# Patient Record
Sex: Female | Born: 1935 | Race: White | Hispanic: No | Smoking: Never smoker
Health system: Southern US, Community
[De-identification: ages and names within clinical notes are randomized; demographics above are authoritative.]

## PROBLEM LIST (undated history)

## (undated) DIAGNOSIS — Z9981 Dependence on supplemental oxygen: Secondary | ICD-10-CM

## (undated) DIAGNOSIS — J189 Pneumonia, unspecified organism: Secondary | ICD-10-CM

## (undated) DIAGNOSIS — M1990 Unspecified osteoarthritis, unspecified site: Secondary | ICD-10-CM

## (undated) DIAGNOSIS — M19019 Primary osteoarthritis, unspecified shoulder: Secondary | ICD-10-CM

## (undated) DIAGNOSIS — J42 Unspecified chronic bronchitis: Secondary | ICD-10-CM

## (undated) DIAGNOSIS — E669 Obesity, unspecified: Secondary | ICD-10-CM

## (undated) DIAGNOSIS — J4489 Other specified chronic obstructive pulmonary disease: Secondary | ICD-10-CM

## (undated) DIAGNOSIS — E785 Hyperlipidemia, unspecified: Secondary | ICD-10-CM

## (undated) DIAGNOSIS — R000 Tachycardia, unspecified: Secondary | ICD-10-CM

## (undated) DIAGNOSIS — Z9109 Other allergy status, other than to drugs and biological substances: Secondary | ICD-10-CM

## (undated) DIAGNOSIS — R413 Other amnesia: Secondary | ICD-10-CM

## (undated) DIAGNOSIS — I2699 Other pulmonary embolism without acute cor pulmonale: Secondary | ICD-10-CM

## (undated) DIAGNOSIS — J449 Chronic obstructive pulmonary disease, unspecified: Secondary | ICD-10-CM

## (undated) DIAGNOSIS — I10 Essential (primary) hypertension: Secondary | ICD-10-CM

## (undated) HISTORY — PX: DILATION AND CURETTAGE OF UTERUS: SHX78

## (undated) HISTORY — DX: Other amnesia: R41.3

## (undated) HISTORY — PX: CATARACT EXTRACTION, BILATERAL: SHX1313

## (undated) HISTORY — PX: TONSILLECTOMY: SUR1361

## (undated) HISTORY — PX: CARDIAC CATHETERIZATION: SHX172

## (undated) HISTORY — PX: ABDOMINAL HYSTERECTOMY: SHX81

---

## 1998-07-17 ENCOUNTER — Emergency Department (HOSPITAL_COMMUNITY): Admission: EM | Admit: 1998-07-17 | Discharge: 1998-07-17 | Payer: Self-pay | Admitting: Emergency Medicine

## 1998-07-17 ENCOUNTER — Encounter: Payer: Self-pay | Admitting: Emergency Medicine

## 1999-03-25 ENCOUNTER — Other Ambulatory Visit: Admission: RE | Admit: 1999-03-25 | Discharge: 1999-03-25 | Payer: Self-pay | Admitting: Obstetrics and Gynecology

## 1999-10-23 ENCOUNTER — Emergency Department (HOSPITAL_COMMUNITY): Admission: EM | Admit: 1999-10-23 | Discharge: 1999-10-23 | Payer: Self-pay | Admitting: Emergency Medicine

## 1999-11-06 ENCOUNTER — Emergency Department (HOSPITAL_COMMUNITY): Admission: EM | Admit: 1999-11-06 | Discharge: 1999-11-06 | Payer: Self-pay | Admitting: Internal Medicine

## 2000-01-12 ENCOUNTER — Encounter (INDEPENDENT_AMBULATORY_CARE_PROVIDER_SITE_OTHER): Payer: Self-pay | Admitting: Specialist

## 2000-01-12 ENCOUNTER — Other Ambulatory Visit: Admission: RE | Admit: 2000-01-12 | Discharge: 2000-01-12 | Payer: Self-pay | Admitting: Otolaryngology

## 2001-08-28 ENCOUNTER — Other Ambulatory Visit: Admission: RE | Admit: 2001-08-28 | Discharge: 2001-08-28 | Payer: Self-pay | Admitting: Internal Medicine

## 2004-01-27 ENCOUNTER — Inpatient Hospital Stay (HOSPITAL_COMMUNITY): Admission: EM | Admit: 2004-01-27 | Discharge: 2004-02-03 | Payer: Self-pay | Admitting: Emergency Medicine

## 2004-07-14 ENCOUNTER — Emergency Department (HOSPITAL_COMMUNITY): Admission: EM | Admit: 2004-07-14 | Discharge: 2004-07-15 | Payer: Self-pay | Admitting: Emergency Medicine

## 2006-08-23 ENCOUNTER — Encounter: Admission: RE | Admit: 2006-08-23 | Discharge: 2006-08-23 | Payer: Self-pay | Admitting: Endocrinology

## 2006-10-06 ENCOUNTER — Ambulatory Visit: Payer: Self-pay | Admitting: Internal Medicine

## 2006-10-06 ENCOUNTER — Inpatient Hospital Stay (HOSPITAL_COMMUNITY): Admission: EM | Admit: 2006-10-06 | Discharge: 2006-10-11 | Payer: Self-pay | Admitting: Emergency Medicine

## 2006-10-09 ENCOUNTER — Encounter (INDEPENDENT_AMBULATORY_CARE_PROVIDER_SITE_OTHER): Payer: Self-pay | Admitting: Cardiology

## 2008-04-04 ENCOUNTER — Ambulatory Visit: Payer: Self-pay | Admitting: Internal Medicine

## 2008-09-12 ENCOUNTER — Encounter: Admission: RE | Admit: 2008-09-12 | Discharge: 2008-09-12 | Payer: Self-pay | Admitting: Internal Medicine

## 2008-09-22 ENCOUNTER — Inpatient Hospital Stay (HOSPITAL_COMMUNITY): Admission: EM | Admit: 2008-09-22 | Discharge: 2008-09-27 | Payer: Self-pay | Admitting: Emergency Medicine

## 2008-09-25 ENCOUNTER — Encounter (INDEPENDENT_AMBULATORY_CARE_PROVIDER_SITE_OTHER): Payer: Self-pay | Admitting: Internal Medicine

## 2009-03-05 IMAGING — CT CT HEAD W/O CM
3 of 5 series · 16 of 47 positions shown, 19 images · IV contrast (omnipaque)
Comparison: Prior CT brain of 07/14/04.
COMPARISON: CT angio chest of 01/27/04.  Some scarring is noted in the lower lobes.

CLINICAL DATA: Syncope.  Short of breath with exertion.  
 HEAD CT WITHOUT CONTRAST:
TECHNIQUE: Contiguous axial images were obtained from the base of the skull through the vertex according to standard protocol without contrast.
TECHNIQUE: Multidetector CT imaging of the chest was performed during bolus injection of intravenous contrast.  Multiplanar   CT angiographic image reconstructions were generated to evaluate the vascular anatomy.
 Contrast:  125 cc Omnipaque 300

[Series 7: pe study · axial · 0.55mm/px · z∈[-368,-95]mm · 10 of 123 slices shown, 13 images]
[im 7/123  brain]
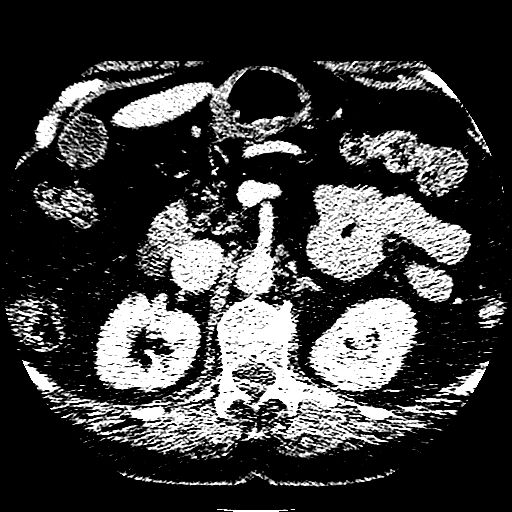
[im 7/123  bone]
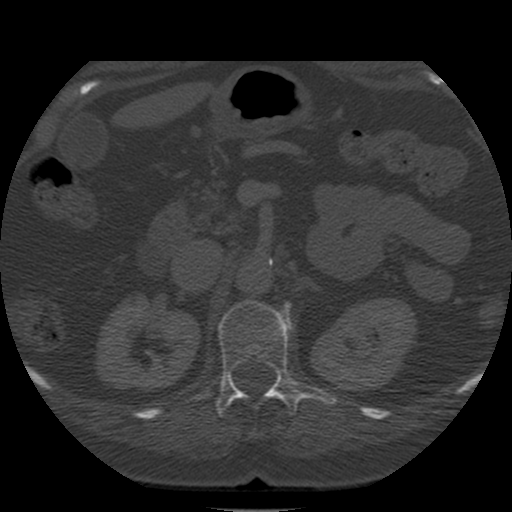
[im 20/123  brain]
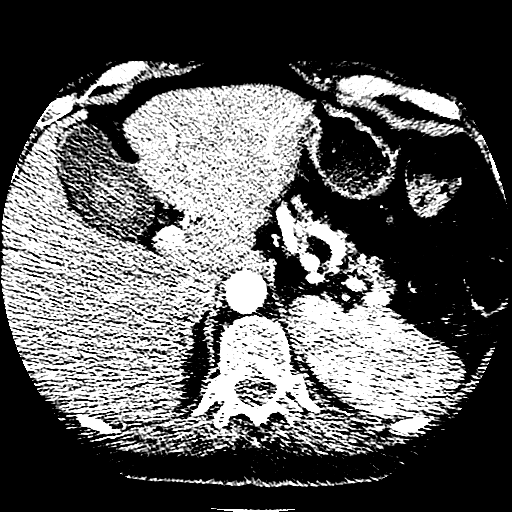
[im 33/123  brain]
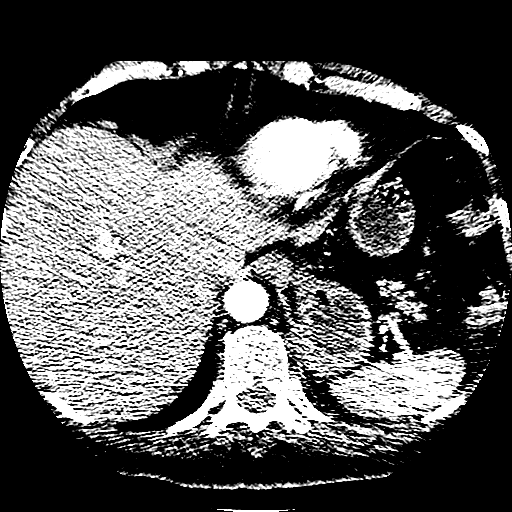
[im 45/123  brain]
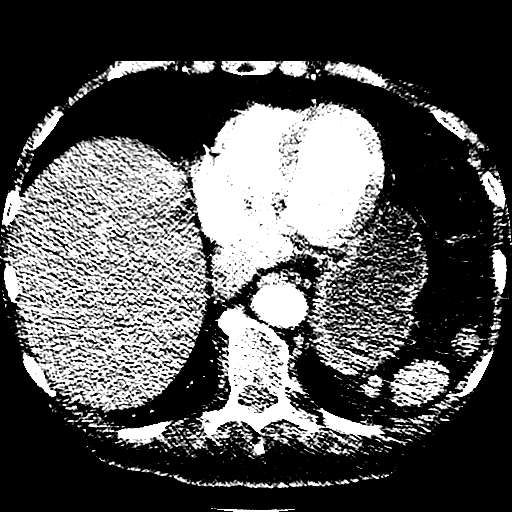
[im 58/123  brain]
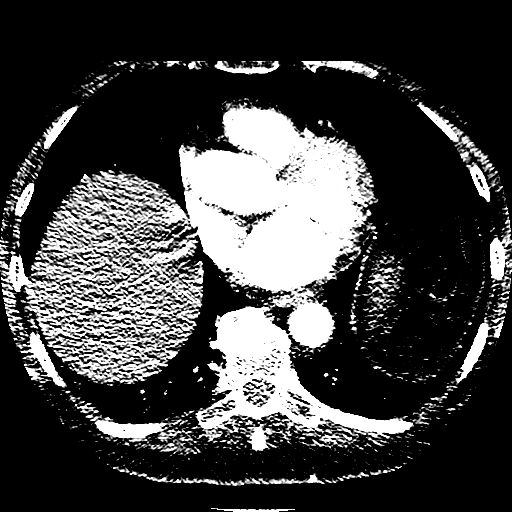
[im 58/123  bone]
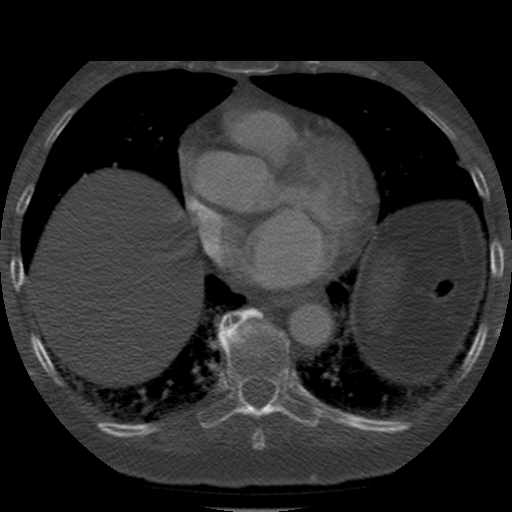
[im 65/123  brain]
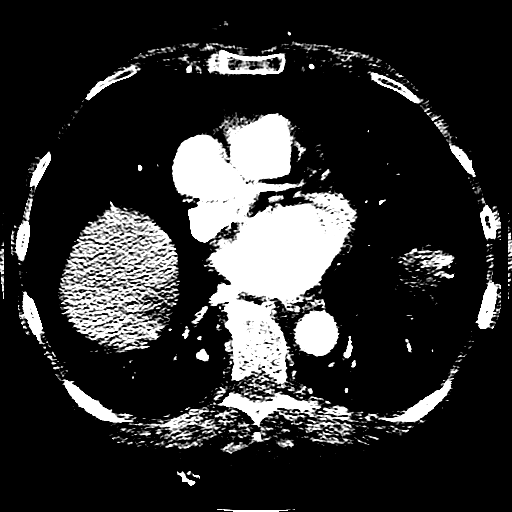
[im 78/123  brain]
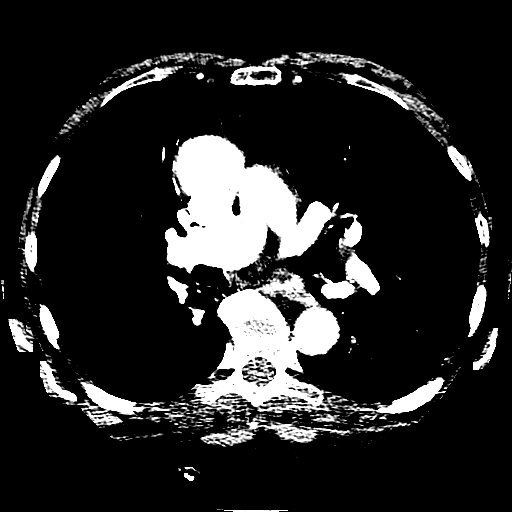
[im 90/123  brain]
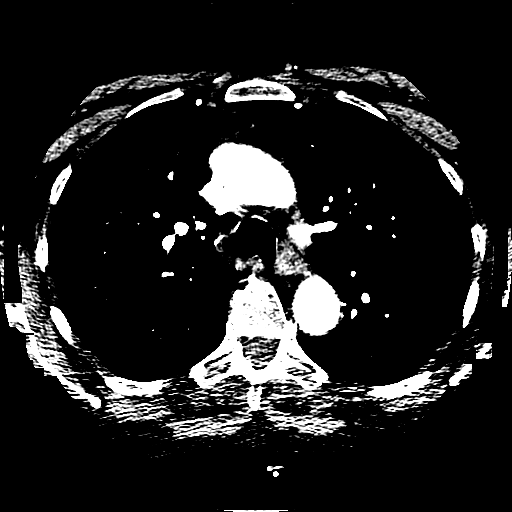
[im 103/123  brain]
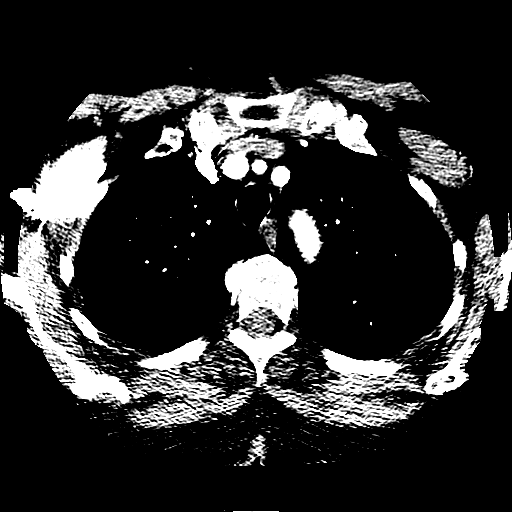
[im 103/123  bone]
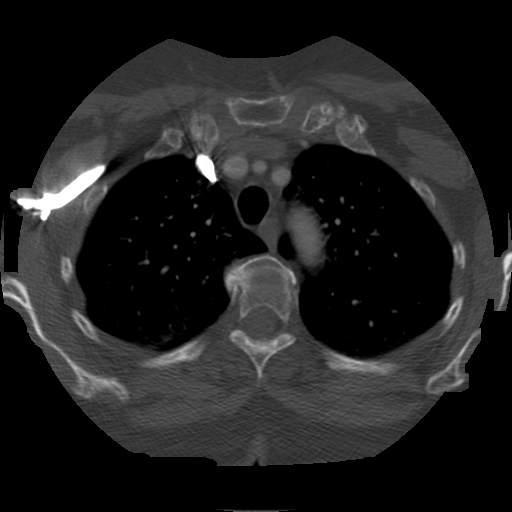
[im 116/123  brain]
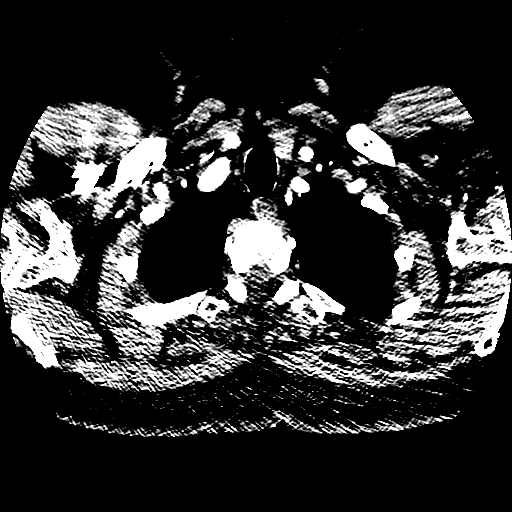

[Series 900: coronal chest · coronal · 0.61mm/px · 3 of 145 slices shown]
[im 49/145  brain]
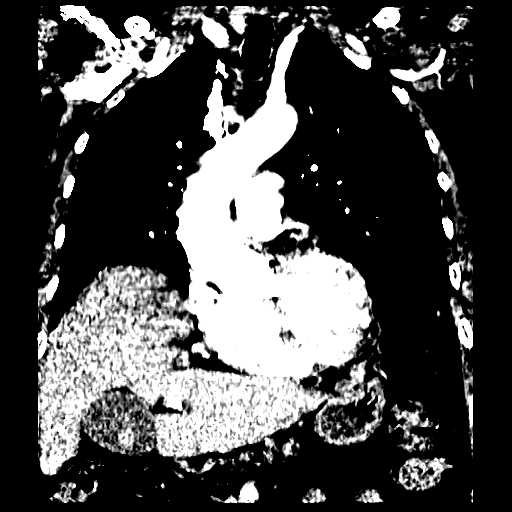
[im 65/145  brain]
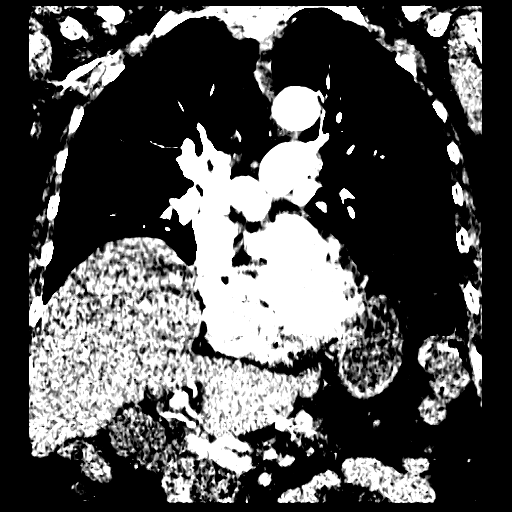
[im 81/145  brain]
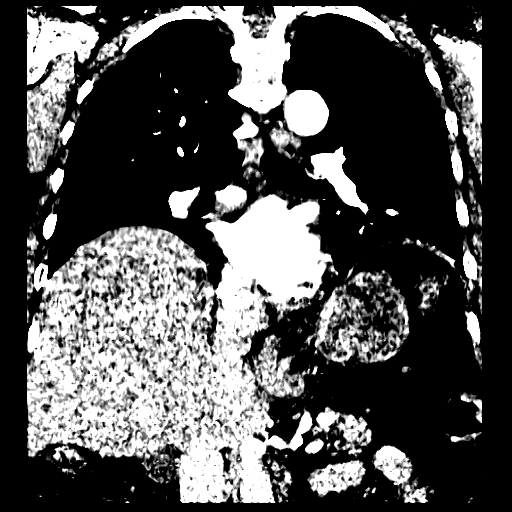

[Series 901: sagittal chest · sagittal · 0.61mm/px · 3 of 188 slices shown]
[im 63/188  brain]
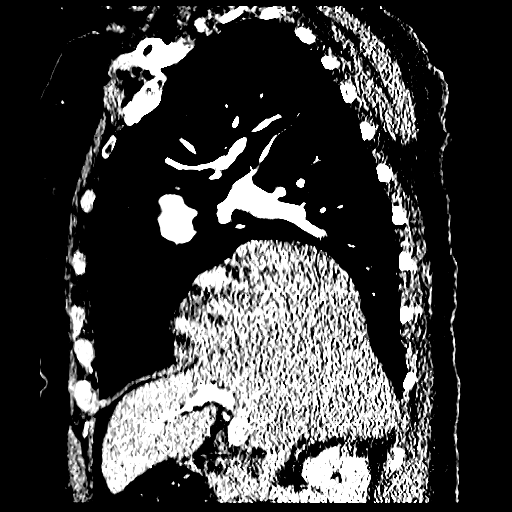
[im 94/188  brain]
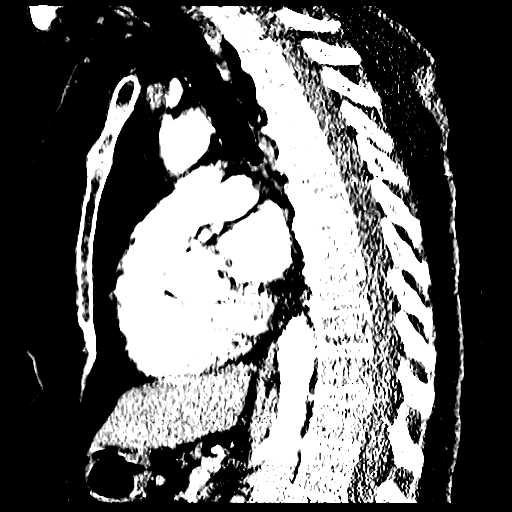
[im 125/188  brain]
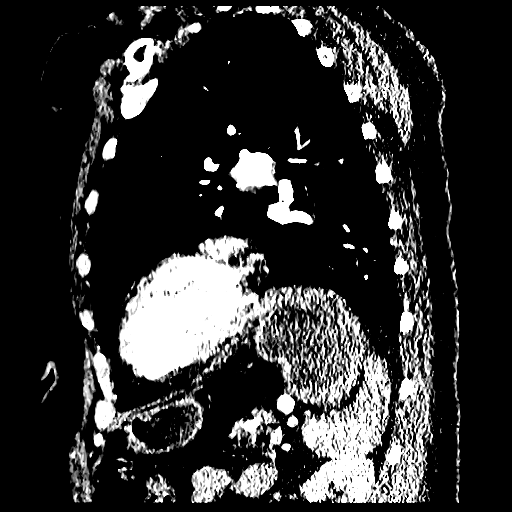

[16 of 47 positions shown; findings below may reference images not displayed]

FINDINGS: Encephalomalacia in the right frontal lobe is stable.  The ventricular system is unchanged in size and configuration and the septum is midline in position.  The 4th ventricle and basilar cisterns are stable.  No blood, edema, or mass effect is seen.  There is evidence of right maxillary sinus disease.  No calvarial fracture is seen.
IMPRESSION: 1.  No acute intracranial abnormality.  Right frontal encephalomalacia again noted. 
 2.  Right maxillary sinus disease. 
 CT ANGIOGRAPHY OF CHEST:
FINDINGS: The pulmonary arteries opacify with no evidence of pulmonary embolism.  The thoracic aorta also opacifies with no acute abnormality.  No mediastinal or hilar adenopathy is seen.  There is higher attenuation material within the gallbladder which could represent gallbladder sludge or possibly noncalcified gallstone.  On lung window images no active process is seen.  No effusion is noted.
IMPRESSION: 1.  No evidence of pulmonary embolism.  No active infiltrate or effusion.  No adenopathy.
 2.  Increased attenuation within the gallbladder.  Gallbladder sludge versus noncalcified gallstone.

## 2009-12-29 ENCOUNTER — Encounter: Admission: RE | Admit: 2009-12-29 | Discharge: 2009-12-29 | Payer: Self-pay | Admitting: Internal Medicine

## 2010-06-27 ENCOUNTER — Encounter: Payer: Self-pay | Admitting: Endocrinology

## 2010-09-15 LAB — BLOOD GAS, ARTERIAL
Acid-Base Excess: 2.9 mmol/L — ABNORMAL HIGH (ref 0.0–2.0)
Bicarbonate: 27.6 mEq/L — ABNORMAL HIGH (ref 20.0–24.0)
O2 Saturation: 94 %
Patient temperature: 98.6
TCO2: 23.7 mmol/L (ref 0–100)
TCO2: 24.6 mmol/L (ref 0–100)
pCO2 arterial: 40.6 mmHg (ref 35.0–45.0)

## 2010-09-15 LAB — URINE MICROSCOPIC-ADD ON

## 2010-09-15 LAB — DIFFERENTIAL
Basophils Absolute: 0.1 10*3/uL (ref 0.0–0.1)
Basophils Relative: 1 % (ref 0–1)
Monocytes Relative: 9 % (ref 3–12)
Neutro Abs: 3.7 10*3/uL (ref 1.7–7.7)
Neutrophils Relative %: 54 % (ref 43–77)

## 2010-09-15 LAB — PROTIME-INR
INR: 1 (ref 0.00–1.49)
Prothrombin Time: 13.3 seconds (ref 11.6–15.2)

## 2010-09-15 LAB — BASIC METABOLIC PANEL
BUN: 11 mg/dL (ref 6–23)
BUN: 14 mg/dL (ref 6–23)
BUN: 14 mg/dL (ref 6–23)
CO2: 26 mEq/L (ref 19–32)
CO2: 30 mEq/L (ref 19–32)
Calcium: 9.6 mg/dL (ref 8.4–10.5)
Chloride: 103 mEq/L (ref 96–112)
Chloride: 106 mEq/L (ref 96–112)
Creatinine, Ser: 0.71 mg/dL (ref 0.4–1.2)
GFR calc non Af Amer: 57 mL/min — ABNORMAL LOW (ref >60)
GFR calc non Af Amer: >60 mL/min (ref >60)
GFR calc non Af Amer: >60 mL/min (ref >60)
Glucose, Bld: 112 mg/dL — ABNORMAL HIGH (ref 70–99)
Glucose, Bld: 115 mg/dL — ABNORMAL HIGH (ref 70–99)
Glucose, Bld: 125 mg/dL — ABNORMAL HIGH (ref 70–99)
Potassium: 3.5 mEq/L (ref 3.5–5.1)
Potassium: 3.6 mEq/L (ref 3.5–5.1)
Sodium: 142 mEq/L (ref 135–145)
Sodium: 143 mEq/L (ref 135–145)

## 2010-09-15 LAB — CK TOTAL AND CKMB (NOT AT ARMC)
CK, MB: 2.1 ng/mL (ref 0.3–4.0)
Total CK: 44 U/L (ref 7–177)

## 2010-09-15 LAB — CARDIAC PANEL(CRET KIN+CKTOT+MB+TROPI)
CK, MB: 2.6 ng/mL (ref 0.3–4.0)
Relative Index: INVALID (ref 0.0–2.5)
Relative Index: INVALID (ref 0.0–2.5)
Total CK: 52 U/L (ref 7–177)
Troponin I: 0.01 ng/mL (ref 0.00–0.06)

## 2010-09-15 LAB — COMPREHENSIVE METABOLIC PANEL
ALT: 28 U/L (ref 0–35)
BUN: 19 mg/dL (ref 6–23)
CO2: 27 mEq/L (ref 19–32)
Chloride: 106 mEq/L (ref 96–112)
GFR calc Af Amer: >60 mL/min (ref >60)
GFR calc non Af Amer: >60 mL/min (ref >60)
Glucose, Bld: 114 mg/dL — ABNORMAL HIGH (ref 70–99)
Sodium: 142 mEq/L (ref 135–145)

## 2010-09-15 LAB — URINE CULTURE: Colony Count: 50000

## 2010-09-15 LAB — CBC
HCT: 36 % (ref 36.0–46.0)
HCT: 42.8 % (ref 36.0–46.0)
Hemoglobin: 12.3 g/dL (ref 12.0–15.0)
Hemoglobin: 12.4 g/dL (ref 12.0–15.0)
Hemoglobin: 14.6 g/dL (ref 12.0–15.0)
MCHC: 34.3 g/dL (ref 30.0–36.0)
MCV: 90.4 fL (ref 78.0–100.0)
MCV: 91.6 fL (ref 78.0–100.0)
Platelets: 188 10*3/uL (ref 150–400)
RBC: 3.95 MIL/uL (ref 3.87–5.11)
RDW: 13 % (ref 11.5–15.5)
WBC: 5.7 10*3/uL (ref 4.0–10.5)
WBC: 6.9 10*3/uL (ref 4.0–10.5)

## 2010-09-15 LAB — URINALYSIS, ROUTINE W REFLEX MICROSCOPIC
Ketones, ur: NEGATIVE mg/dL
Leukocytes, UA: NEGATIVE
Specific Gravity, Urine: 1.028 (ref 1.005–1.030)
pH: 5.5 (ref 5.0–8.0)

## 2010-09-15 LAB — TSH: TSH: 1.049 u[IU]/mL (ref 0.350–4.500)

## 2010-10-19 NOTE — Letter (Signed)
April 04, 2008    Massie Maroon, MD  [address]  [place], [place] [address]   RE:  Kristina Watson, Kristina Watson  MRN:  [id number]  /  DOB:  1936-04-10   Dear Dr. Selena Batten:   Thanks for referring back Ms. Particia Lather for Cardiology and EP  followup.  As you know, she is a very pleasant 75 year old woman with a  history of hypertension and history of DVTs and pulmonary embolism in  the past.  The patient was hospitalized back in May 2008, with  nonsustained ventricular tachycardia and fairly frequent ventricular  ectopy all in the setting of frank syncope.  Since then, she has had no  recurrent syncope, and the etiology of her syncope at that time was  certainly unclear.  Despite her multiple episodes of syncope, she has  had non since.  The patient does note continued and occasional  palpitations.  She also has dizziness and dyspnea with exertion  particularly when she walks up hill or tries to go too fast.  She states  that she will gradually become short of breath and has to stop what she  is doing, but on resting she feels better.  She returns today for  additional evaluation.  The patient had no other specific complaints  except that she notes arm pain particularly when she lies on her left  side at night while she sleeps.  She also admitted to some problems with  wild snoring in the nighttime.  Otherwise, there were no specific  complaints.   SOCIAL HISTORY:  She denies tobacco or ethanol abuse.   FAMILY HISTORY:  Noncontributory.   REVIEW OF SYSTEMS:  Notable for dyspnea with exertion particularly  inclines and upstairs, dizziness with exertion, but no recent syncope.  She does not have pleuritic chest pain today.  She has mild chronic  peripheral edema.   PHYSICAL EXAMINATION:  GENERAL:  She is a pleasant 75 year old woman in  no acute distress.  VITAL SIGNS:  Blood pressure today was 134/92, the pulse was 87 and  regular, respirations were 18, the weight was 231 pounds.  HEENT:   Normocephalic and atraumatic.  Pupils were equal and round.  The  oropharynx is moist.  Sclerae were anicteric.  NECK:  No jugular venous distention.  There are no thyromegaly.  Trachea  was midline.  The carotids are 2+ and symmetrical.  LUNGS:  Clear bilaterally to auscultation.  No wheezes, rales, or  rhonchi are present.  No increased work of breathing.  CARDIOVASCULAR:  Regular rate and rhythm with normal S1 and S2, and  there is a 2/6 systolic murmur at the left lower sternal border.  The  PMI was not enlarged nor was laterally displaced.  ABDOMEN:  Soft and nontender.  EXTREMITIES:  Trace peripheral edema bilaterally.   EKG today demonstrates sinus rhythm with nonspecific ST-T wave  abnormality.  Orthostatic vitals demonstrated no evidence of orthostasis  and her heart rate increased by 15 points going from lying to standing.   IMPRESSION:  1. Remote syncope, now quiescent.  2. Dyspnea with exertion.  3. Hypertension and hypertensive heart disease, on beta-blockers.  4. Probable chronic obstructive pulmonary disease.   DISCUSSION:  I think much of Ms. Blaszczyk's difficulties were result from  hypertension and some deconditioning.  I have encouraged her to maintain  a low-salt diet.  She has been asked to continue taking her beta-  blockers.  She may ultimately need additional antihypertensive meds, but  I will  leave this to your discretion.  We did offer her flu vaccine  today.  Because she has had no recurrent syncope at this point, I would  recommend a period of watchful waiting with no additional diagnostic  workup at this point in time, though she had recurrent syncope, then  consideration for an implantable defibrillator will be made.  I planned  to see the patient back in 1  year or sooner should she have additional symptoms.  The patient does  give [place] a history of fairly loud snoring, and I do wonder if perhaps  some sleep apnea could be playing a role here as well, but I  have not  referred her for sleep study but we will be happy to do so or have you  do so as you deem most appropriate.    Sincerely,      Doylene Canning. Ladona Ridgel, MD  Electronically Signed    GWT/MedQ  DD: 04/04/2008  DT: 04/05/2008  Job #: [id number]
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
## 2010-10-22 NOTE — Consult Note (Signed)
NAMERAEVYN, SOKOL NO.:  [id number]   MEDICAL RECORD NO.:  [id number]          PATIENT TYPE:  INP   LOCATION:  6531                         FACILITY:  [facility]   PHYSICIAN:  Doylene Canning. Ladona Ridgel, MD    DATE OF BIRTH:  [birth year]   DATE OF CONSULTATION:  10/10/2006  DATE OF DISCHARGE:                                 CONSULTATION   INDICATION FOR CONSULTATION:  Evaluation of nonsustained VT, frequent  ventricular ectopy in a patient with syncope.   HISTORY OF PRESENT ILLNESS:  The patient is a 75 year old woman who has  a history of hypertension and DVTs in the past, also a history of  pulmonary embolism in the past.  She was in her usual state of health  until approximately 2 weeks ago when she was at church and had been  standing, suddenly felt lightheaded and was found initially  unresponsive.  She woke up very quickly within a few seconds and was  noted be confused.  She states that she has had episodes of passing out  in the past, although none recently.  The patient did not experience  palpitations with that episode.  She was, however, seen in her  physician's office several days ago and was found to be having  palpitations and actual nonsustained VT in the office and was admitted  for additional evaluation.   ASSESSMENT:  Catheterization demonstrates preserved LV function and no  obstructive coronary disease.  The patient is now referred for  additional evaluation.  She has been placed initially on amiodarone and  now onto beta blockers.  Her PVCs are reduced in frequency, although,  they are still present.  She does not have much in the way of symptoms,  though, she does occasionally feel palpitations.  She denies chest  pressure.   PAST MEDICAL HISTORY:  As noted in the HPI.  She also has a history of  morbid obesity.   SOCIAL HISTORY:  The patient lives in [place] with her son.  She  worked as a Veterinary surgeon in the past.  She denies tobacco  or  ethanol use.   FAMILY HISTORY:  Notable for mother dying of pancreatic cancer at age  42, and father died of MI at age 50.  She had a brother who died in his  25s, presumably of congenital heart disease.   REVIEW OF SYSTEMS:  As noted in the HPI.  She also has a history of  gastroesophageal reflux symptoms and occasional constipation.  She has a  history of dizzy spells as previously noted.  She has a history of  dyspnea on exertion.  She does note some dizziness when she walks up  flights of stairs.  The rest of review of systems was negative.   PHYSICAL EXAMINATION:  GENERAL:  Notable for a pleasant 76 year old  woman in no acute distress.  VITAL SIGNS:  Blood pressure was 134/80, pulse was 60 and regular,  respirations were 18, weight 224 pounds, height 5 feet 9 inches tall.  HEENT:  Normocephalic and atraumatic.  Pupils equal and round.  Oropharynx moist.  Sclerae anicteric.  NECK:  Revealed no jugular distension.  There is no thyromegaly.  Trachea is midline.  Carotid 2+ and symmetric.  LUNGS:  Clear bilaterally to auscultation.  No wheezes, rales or rhonchi  were present.  CARDIAC:  Regular rate and rhythm with occasional premature beats.  The  PMI was not enlarged nor was it laterally displaced.  ABDOMEN:  Obese, nontender, nondistended.  There is no organomegaly.  The bowel sounds are present.  There is no rebound or guarding.  EXTREMITIES:  Demonstrate no cyanosis, clubbing or edema.  Pulses 2+  symmetric.  NEUROLOGIC:  Alert and oriented x3.  Cranial nerves intact.  Strength  5/5 and symmetric.   STUDIES:  EKG demonstrates sinus rhythm with occasional PVCs.  Review of  the prior ECG demonstrates sinus rhythm with a fairly dense ventricular  ectopy.  The catheterization demonstrates normal LV function with no  obstructive coronary disease.  Her 2-D echo is pending.   IMPRESSION:  1. Syncope, most likely orthostatic, not related to palpitations.  2. Frequent PVCs  and documented episodes of nonsustained VT.   DISCUSSION:  The etiology of the patient's symptoms, that is the  palpitations, are likely originated from the RV outflow tract based on  the QRS morphology of the PVCs.  These have improved, although, not gone  away with beta blocker therapy following amiodarone therapy.  With  regard to her ventricular ectopy, I have recommended continuation of  beta blockers.  Calcium channel blockers could also be added at a later  date if needed.  Her syncope seems unrelated to her ectopy, although I  cannot definitively excluded it.  For this, I have recommended a period  of watchful waiting.  Also, would recommend review of the 2-D echo.  If  she had right ventricular enlargement by echo or RV dysfunction, then we  would consider __________ right ventricular dysplasia as a possible  cause her symptoms, which might lead in fact to recommendation for  additional treatments.  If her RV and LV function are both preserved,  then I would recommend continuation of her beta blockers and no other  therapy at the present time, but a period of watchful waiting.      Doylene Canning. Ladona Ridgel, MD  Electronically Signed     GWT/MEDQ  D:  10/10/2006  T:  10/10/2006  Job:  [id number]   cc:   Tama Gander, M.D.

## 2010-10-22 NOTE — Discharge Summary (Signed)
Kristina Watson, Kristina Watson                            ACCOUNT NO.:  [id number]   MEDICAL RECORD NO.:  [id number]                   PATIENT TYPE:  INP   LOCATION:  0357                                 FACILITY:  [facility]   PHYSICIAN:  Lonia Blood, M.D.                   DATE OF BIRTH:  07-Jan-1936   DATE OF ADMISSION:  01/27/2004  DATE OF DISCHARGE:  02/03/2004                                 DISCHARGE SUMMARY   PRIMARY CARE PHYSICIAN:  Janae Bridgeman. Lendell Caprice, M.D.   DISCHARGE DIAGNOSES:  1.  Pulmonary embolism.  2.  Cholelithiasis.  3.  Klebsiella urinary tract infection.  4.  Anemia.  5.  Mild hypotension.  6.  Cough with mild pulmonary infiltrate.  7.  Otitis media.   DISCHARGE MEDICATIONS:  1.  Coumadin 7.5 mg daily to be adjusted as an outpatient.  2.  Lisinopril 10 mg daily.  3.  Ceftin 250 mg t.i.d. for three more days.   PROCEDURE PERFORMED:  1.  Chest CT without contrast performed on January 27, 2004, that shows      pulmonary embolism which is large on the right side of the lung; also      two other emboli seen in the right upper lobe; also bibasilar opacities      most likely atelectasis seen there.  2.  Abdominal ultrasound performed on January 28, 2004, that showed      cholelithiasis with mild gallbladder wall thickening and two small      simple cysts of the liver.  3.  Chest x-ray performed on January 27, 2004, shows optimal inspiration with      bibasilar volume loss and a follow-up chest x-ray performed on January 30, 2004, that showed persistent bibasilar atelectasis.  4.  Doppler ultrasound of the lower extremities performed on January 28, 2004, that shows right-sided DVT in the right calf, but no DVT on the      left.   CONSULTATIONS:  None.   BRIEF HISTORY AND PHYSICAL:  Kristina Watson presented to the emergency room here  at [facility] with complaint of right-sided chest pain as well as  dizziness. She was noted in the emergency room to have a positive D-dimer  which led to his CT scan of the chest, PE protocol that shows right-sided  pulmonary embolism. For the most part, the patient has been immobile at  home. There is no clinical evidence of a precipitating cause for her DVT or  subsequent pulmonary embolism. A follow-up evaluation with Doppler showed  that she had a right-sided deep venous thrombosis. She was therefore  admitted for treatment of that. Other findings on admission included  evidence of a UTI with a white count of 21 to 50 in her urine and bacteruria  which later came back as Klebsiella ozaenae that was resistant to  ampicillin, but sensitive for the most part to antibiotics of  nitrofurantoin. Her LFTs were also found to be elevated on admission with  AST of 75, ALT 74, and alkaline phosphatase of 186.   HOSPITAL COURSE:   PROBLEM #1:  Pulmonary embolism.  With the finding the patient was admitted  and started on heparin to bridge with her Coumadin. She was also started on  Coumadin simultaneously and her INR was followed closely. At the time of  this discharge her INR was therapeutic at 2.2.   PROBLEM #2:  Possible pneumonia. The patient had cough and fever. Initially,  she was taken off her lisinopril. However, with fever and the finding of  basilar atelectasis that was felt to be responsible. She was started on  antibiotics subsequently in the beginning. She was on Cipro that was later  changed to cefuroxime because she is on Coumadin.   PROBLEM #3:  UTI. The patient's presentation as mentioned showed evidence of  UTI and Klebsiella was cultured. She was subsequently treated with Cipro,  latex, and cefuroxime at the time of discharge which she will complete for a  total of 10 days.   PROBLEM #4:  Cholelithiasis. The patient had elevated LFTs although she did  not have any evidence of acute cholelithiasis. An abdominal ultrasound  performed confirmed cholelithiasis. She was followed closely and her LFTs  trended  downward prior to discharge.   PROBLEM #5:  Hypertension. The patient has a history of hypertension and her  blood pressure seems to reflect in the hospital. She had transient low blood  pressure initially, but later her blood pressure was rebounded and was a  little bit off. She did have some mild changes in her EKG with some  tachycardia with some elements of ventricular tachycardia that was  nonsustained. That was seen on January 30, 2004, but there has not been any  recurrence up to the time of this discharge.   PROBLEM #6:  Otitis media. The patient had some bouts of mild dizziness and  nausea on February 02, 2004. Subsequent examination of  her oral cavity showed  a full  tympanic membrane, slightly dull which may request otitis media. She  was continued on cefuroxime which she will be cover that infection. She was  subsequently discharged on cefuroxime as well. At the time of discharge she  has done so well and I do not have any new problems.   DISCHARGE LABORATORY:  The patient's white count is 6.4, hemoglobin 11.4,  platelet count 404,000. PT 20.7, INR 2.2.                                               Lonia Blood, M.D.    Verlin Grills  D:  02/03/2004  T:  02/03/2004  Job:  [id number]   cc:   Janae Bridgeman. Eloise Harman., M.D.  [address]  [place]  [place] [address]  Fax: [phone]

## 2010-10-22 NOTE — Discharge Summary (Signed)
Kristina Watson, Kristina Watson NO.:  [id number]   MEDICAL RECORD NO.:  [id number]          PATIENT TYPE:  EMS   LOCATION:  ED                           FACILITY:  [facility]   PHYSICIAN:  Gertha Calkin, M.D.DATE OF BIRTH:  18-Oct-1935   DATE OF ADMISSION:  07/14/2004  DATE OF DISCHARGE:  07/15/2004                                 DISCHARGE SUMMARY   PRIMARY CARE PHYSICIAN:  Dr. Lendell Caprice.   [name], obese, white female with a past medical history  significant for PE/DVT, cholelithiasis, previous admission with Klebsiella  UTI and anemia who presents near syncopal episode earlier yesterday  afternoon. She states the episode occurred after she used __________ and  urinated. When she got up she felt light headed and sat back down.  She felt  woozy and no nausea, no chest pain, no palpitations, no diaphoresis. There  was no loss of bowel or bladder control. She denies ever losing  consciousness. She recently states that she has not had any problems with  fevers, chills, abdominal pain, noted to have any melanotic stools or  hematochezia. She states that she takes all her medications at night (please  see list below).  She states she has had some cough with some sputum;  however, this was white and it has not bother her to any great extent.   PAST MEDICAL HISTORY:  PE/DVT, cholelithiasis not requiring surgery and  history of previous admission with anemia.   MEDICATIONS:  1.  Coumadin 5 mg p.o. q.h.s.  2.  Lisinopril 10 mg p.o. q.h.s.  3.  Zyrtec 10 mg p.o. q.d.  4.  No over the counter medications.   ALLERGIES:  Aspirin which causes her to have nausea.   FAMILY HISTORY:  Noncontributory; however, no history of clots. No cancers  in the family.   SOCIAL HISTORY:  [place]ives in town.  Denies, tobacco, alcohol or drug use.   REVIEW OF SYMPTOMS:  Negative except for HPI.   PHYSICAL EXAMINATION:  VITAL SIGNS:  She is afebrile, blood pressure 122/50,  pulse 83,  respirations 20, sating 100% on room air.  GENERAL:  She is a slightly obese white female with acute distress laying in  her examining bed.  HEENT:  Unremarkable.  CARDIOVASCULAR:  Regular rate and rhythm with no murmurs, rubs or gallops.  CHEST:  Clear to auscultation bilaterally with good air movement.  ABDOMEN:  Obese, nontender, nondistended, positive bowel sounds.  EXTREMITIES:  Without cyanosis, clubbing or edema. Pulses are intact and  symmetric upper and lower extremities.  NEUROLOGIC:  Cranial nerves II-XII intact, no focal deficits, no focal  sensation deficits. No strength deficits.   LABORATORY DATA:  It should be noted that orthostatics were done and she was  negative for orthostasis.  Point of care markers negative.  D-dimer was  elevated at 1.92. PT is 18.1, INR is 1.8.  White count of 8.9, hemoglobin of  13.4, hematocrit of 39.1, platelets of 263.  Sodium 139, potassium 3.7,  chloride 106, bicarb 29, glucose 122, BUN 34, creatinine 1.4, calcium 9.7.   CT chest  with contrast shows no PE's.  CT contrast also noted for tiny right  lower lobe nodules. Recommend followup three month CT.  EKG shows normal  sinus rhythm.   ASSESSMENT/PLAN:  1.  Near syncopal episode.  2.  History of PE/DVT.  3.  Subtherapeutic INR.  4.  History of anemia now corrected.   ALLERGIES:  Incidental right lung nodules.   Given that this episode is most consistent with micturition, related near  syncope and the fact that she has negative EKG, negative CT scan and almost  therapeutic INR, at this point all worrisome etiologies have been ruled out.  However, since her INR was subtherapeutic at 1.8, I have asked and she has  received 1.5 mg/kg dose of Lovenox which is treatment dose for PE. I have  also instructed patient to up her dose of Coumadin from 5 mg to 7.5 tonight  as well as tomorrow night. She is also to followup with her primary care  physician in 48 hours by Friday to have INR drawn  and followup with her  Coumadin dose. Also noted she needs to have followup of her nodules in three  months as indicated by radiologic data.      JD/MEDQ  D:  07/15/2004  T:  07/15/2004  Job:  [id number]   cc:   Dr. Lendell Caprice

## 2010-10-22 NOTE — Discharge Summary (Signed)
NAMEDAINELLE, HUN NO.:  [id number]   MEDICAL RECORD NO.:  [id number]          PATIENT TYPE:  INP   LOCATION:  6531                         FACILITY:  [facility]   PHYSICIAN:  Marcellus Scott, MD     DATE OF BIRTH:  09/12/1935   DATE OF ADMISSION:  10/06/2006  DATE OF DISCHARGE:  10/11/2006                               DISCHARGE SUMMARY   PRIMARY MEDICAL DOCTOR:  Dr. Selena Batten of [facility]   DISCHARGE DIAGNOSES:  1. Ventricular ectopy/nonsustained ventricular tachycardia.  2. Hypertension.  3. History of syncope.  4. History of bilateral upper extremity deep vein thrombosis.  5. Anemia.  6. Presumed urinary tract infection.   DISCHARGE MEDICATIONS:  1. Metoprolol 50 mg p.o. b.i.d.  2. Enteric-coated aspirin 81 mg p.o. daily.   PROCEDURES:  1. Left cardiac catheterization with selective left-and-right coronary      angiography, left ventriculography via right groin done on Oct 09, 2006 by cardiology.  Please refer to the cardiologists notes for      details.  2. Chest x-ray on Oct 06, 2006; Impression: No active lung disease.   PERTINENT LAB DATA:  CBC on may 6 with a hemoglobin of 11.6, hematocrit  34, MCV 89.7 with blood cells 5.8,  platelets 229.  Coagulation indices  normal.  Basic metabolic panel within a BUN of 14, creatinine 1.08.  Hemoglobin A1c of 5.9.  Lipid panel with her cholesterol of 134,  triglycerides of 91, HDL 40, LDL 76, VLDL 18, TSH of 1.169.   CONSULTATIONS:  1. Cardiology from Dr. Sharyn Lull.  2. EPS from Dr. Ladona Ridgel.   HOSPITAL COURSE AND PATIENT DISPOSITION:  For details of the initial part of the admission, please refer to the  history and physical note that was done by Dr. Lovell Sheehan on Oct 06, 2006.  In summary, Ms. Strayer is a pleasant, 75 year old female patient with  history of hypertension, prior history of deep vein thrombosis not on  anticoagulation currently and hyperlipidemia who was sent to the  emergency  department by her primary care physician with history of an  abnormal EKG in the office which revealed nonsustained ventricular  tachycardia.  The patient was admitted to the hospital for further  evaluation and management.   Problem #1:  VENTRICULAR ECTOPY/NONSUSTAINED VENTRICULAR TACHYCARDIA.  The patient was admitted to the stepdown unit in the hospital.  Cardiac  enzymes were negative.  Electrolytes were normal.  TSH was normal.  She  was placed on IV heparin and amiodarone drip.  Cardiology was consulted.  They proceeded to add metoprolol, aspirin and Plavix to her medications.  The patient was scheduled for a left heart catheterization which was  done on May 5 which was essentially clean.  Cardiology subsequently  consulted EPS who evaluated her and have indicated that overall she has  no obstructive coronary artery disease, preserved LV function, and her  ectopy is not dangerous at this point.  They have suggested titrating up  the dose of beta-blockers and review of her 2-D echo to rule out  structural abnormality  of the right ventricle such as a right  ventricular dysplasia.  Her echo has been done and report is pending.  The patient is continued on aspirin.  Her beta blockers are being  titrated.  Her Plavix and amiodarone has been discontinued.  Once her  echo is reviewed and okay she will be cleared for discharge to follow up  as an outpatient.   Problem #2:  HISTORY OF SYNCOPE, probably secondary to orthostatic  hypotension. In speaking with the patient probably no workup has been  done as an outpatient.  This has to be monitored as an outpatient, and  further workup as deemed necessary by her primary medical doctor.   Problem #3:  HYPERTENSION which is controlled.   Problem #4:  HISTORY OF DVT.  The patient indicated that she had been on  Coumadin for short while before being switched to Plavix.  She is to  follow this up with her primary medical doctor.  The patient  will remain  on aspirin.   Problem #5:  ANEMIA which has remained stable.   Problem #6:  PRESUMED URINARY TRACT INFECTION.  The patient was placed  on ciprofloxacin, however, she was asymptomatic but her urine culture  returned E. coli.  The colony count was not significant, whereby, her  ciprofloxacin has been discontinued.  If the patient does have any  symptoms or becomes febrile, consider treating her with a course of  antibiotics.   The patient has been instructed to seek immediate medical attention in  case of deterioration in any of her medical problems.   cc. Dr Pearson Grippe, [facility]  [facility] [address], [address]  [place], [place] [address]  [phone]  Fax:  [phone]      Marcellus Scott, MD  Electronically Signed     AH/MEDQ  D:  10/10/2006  T:  10/10/2006  Job:  [id number]   cc:   Dr. Larina Bras. Sharyn Lull, M.D.

## 2010-10-22 NOTE — H&P (Signed)
NAMEMICHELINA, Watson NO.:  [id number]   MEDICAL RECORD NO.:  [id number]          PATIENT TYPE:  INP   LOCATION:  2905                         FACILITY:  [facility]   PHYSICIAN:  [name], M.D. DATE OF BIRTH:  1935-11-25   DATE OF ADMISSION:  10/06/2006  DATE OF DISCHARGE:                              HISTORY & PHYSICAL   PRIMARY CARE PHYSICIAN:  Therapist, occupational. Selena Batten.   CHIEF COMPLAINT:  Referred to the hospital secondary to abnormal EKG.   HISTORY OF PRESENT ILLNESS:  [name] female who was sent  to the Emergency Department by her PCP after being found to have an  abnormal EKG in the office which revealed runs of V-tach per Dr. Selena Batten.  The patient reports feeling sick for a few days and of note the patient  has had a history of syncopal episodes off and on for about a month.  The patient has undergone a workup by her PCP for these syncopal  episodes.  She denies having any nausea, vomiting, fevers, chills.  She  also denies having shortness of breath.   PAST MEDICAL HISTORY:  1. Hypertension.  2. Previous DVT and pulmonary embolism.  3. Hyperlipidemia.   PAST SURGICAL HISTORY:  1. Total abdominal hysterectomy in 1962.  2. Prior to her hysterectomy, she did have a tubal ligation.   MEDICATIONS:  1. Lisinopril.  2. Plavix.   ALLERGIES:  No known drug allergies.   SOCIAL HISTORY:  The patient is a retired Chemical engineer.  She  has no history of tobacco usage or alcohol usage.   FAMILY HISTORY:  Noncontributory.   PHYSICAL EXAMINATION FINDINGS:  GENERAL:  A 75 year old obese female in  acute distress.  VITAL SIGNS:  Temperature 98.2.  Blood pressure 131/43 to 108/25.  Heart  rate irregular ranging from 82-111.  Respirations 18-20.  O2 saturation  is 93-96%.  SKIN:  Anicteric.  HEENT:  Normocephalic, atraumatic.  Pupils equally round, react to  light.  Extraocular muscles are intact.  Funduscopic benign.   Oropharynx  is clear.  NECK:  Supple; full range of motion.  No thyromegaly, adenopathy or  jugular venous distention.  CARDIOVASCULAR:  Irregular rate and rhythm.  LUNGS:  Clear to auscultation bilaterally.  ABDOMEN:  Positive bowel sounds, soft, nontender.  EXTREMITIES:  Without edema.  NEUROLOGIC EXAMINATION:  Nonfocal.  GENITOURINARY/RECTAL:  Deferred.   LABORATORY FINDINGS:  Sodium level 137, potassium 3.8, chloride 106, BUN  27, creatinine 1.2, glucose 99, calcium 9.8, magnesium 2.1.  EKG reveals  runs of V-tach and occasional PVCs.   ASSESSMENT:  A 75 year old female being admitted with:  1. A ventricular tachycardia and ventricular ectopy.  2. Hypertension.  3. Atherosclerotic cardiovascular disease.  4. Mild renal insufficiency.   PLAN:  The patient will be admitted to the Coronary Care Unit and  continue with amiodarone loading and then amiodarone drip.  Will follow.  Cardiac enzymes will be performed every 8 hours x3.  The initial cardiac  enzymes were negative.  Myoglobin 164, CK-MB 1.2, troponin less than  0.05.  IV heparin  has been started as well and GI prophylaxis.  Cardiologist, Dr. Sharyn Lull, was called for consultation to see the  patient in the Emergency Department as well.      [name], M.D.  Electronically Signed     HJ/MEDQ  D:  10/07/2006  T:  10/07/2006  Job:  [id number]

## 2010-10-22 NOTE — Op Note (Signed)
[name], Watson NO.:  [id number]   MEDICAL RECORD NO.:  [id number]          PATIENT TYPE:  INP   LOCATION:  2807                         FACILITY:  [facility]   PHYSICIAN:  Eduardo Osier. Sharyn Lull, M.D. DATE OF BIRTH:  04/21/1936   DATE OF PROCEDURE:  10/09/2006  DATE OF DISCHARGE:                               OPERATIVE REPORT   PROCEDURE:  Left cardiac catheterization with selective left and right  coronary angiography, left ventriculography via right groin using  Judkins technique.   INDICATIONS FOR PROCEDURE:  [name] is 75 year old white female with a  past medical history significant for hypertension, history of recurrent  syncope, history of palpitation, history of DVT in the legs, positive  family history of coronary artery disease.  She came to the ER from  PMD's office as the patient was noted to have recurrent episodes of  nonsustained VT associated with feeling weak.  The patient also  complains of exertional dyspnea with minimal exertion associated with  feeling weak and both arm pains.  The patient denies any chest pain,  nausea, vomiting, diaphoresis.  Denies palpitation, lightheadedness, or  syncopal episode today.  Denies any PND, orthopnea, leg swelling.  States approximately 2 weeks ago while walking in church had a syncopal  episode.  No seizure activity was noted.  CT was negative in the past.  The patient received IV amiodarone bolus and was started on drip.  The  patient had nonsustained occasional VT and then also had wide complex  tachycardia with Ashman phenomenon.  Due to multiple risk factors,  history of recurrent syncope, nonsustained VT, I discussed with the  patient regarding left cath, possible PTCA stenting, its risks and  benefits i.e. death, MI, stroke, need for emergency CABG, risk of  restenosis, local vascular complications etc. and consented for the  procedure.   PROCEDURE:  After obtaining informed consent, the patient was  brought to  the cath lab and was placed on fluoroscopy table.  The right groin was  prepped and draped in usual fashion.  Two percent Xylocaine was used for  local anesthesia in the right groin.  With the help of thin-wall needle,  a  6 French arterial sheath was placed.  The sheath was aspirated and  flushed.  Next, a 6 French left Judkins catheter was advanced over the  wire under fluoroscopic guidance up to the ascending aorta.  The wire  was pulled out.  The catheter was aspirated and connected to the  manifold.  The catheter was further advanced and engaged into the left  coronary ostium.  Multiple views of the left system were taken.  Next,  the catheter was disengaged and was pulled out over the wire and was  replaced with a 6 French right Judkins catheter which was advanced over  the wire under fluoroscopic guidance up to the ascending aorta.  The  wire was pulled out, the catheter was aspirated and connected to the  manifold.  The catheter was further advanced and engaged into the right  coronary ostium.  Multiple views of the right system were  taken.  Next,  the catheter was disengaged and was pulled out over the wire and was  replaced with a 6 French pigtail catheter which was advanced over the  wire under fluoroscopic guidance up to the ascending aorta.  The wire  was pulled out.  The catheter was aspirated and connected to the  manifold.  The catheter was further advanced across the aortic valve  into the LV.  LV pressures were recorded.  Next, LV graph was done in 30  degree RAO position.  Post angiographic pressures were recorded from LV,  and then pullback pressures were recorded from the aorta.  There was no  significant gradient across aortic valve.  Next, the pigtail catheter  was pulled out over the wire.  The sheaths were aspirated and flushed.   FINDINGS:  LV showed good LV systolic function.  EF of 50-55%.  Left  main was patent.  The LAD was patent.  Diagonal 1 was  moderate size,  which was patent.  Diagonal 2 was small, which was patent.  Ramus was  small, which was patent.  Left circumflex was patent.  RCA was patent.  PDA was patent, and the LV branches were also patent.   The patient tolerated the procedure well.  There are no complications.  Plan is to maximize beta blockers, considered discontinuing amiodarone.  Will also get an EP consult.           ______________________________  Eduardo Osier. Sharyn Lull, M.D.     MNH/MEDQ  D:  10/09/2006  T:  10/09/2006  Job:  [id number]   cc:   Dr, ___________
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
## 2011-08-11 ENCOUNTER — Encounter (HOSPITAL_COMMUNITY): Payer: Self-pay | Admitting: Emergency Medicine

## 2011-08-11 ENCOUNTER — Other Ambulatory Visit: Payer: Self-pay

## 2011-08-11 ENCOUNTER — Emergency Department (HOSPITAL_COMMUNITY): Payer: Medicare Other

## 2011-08-11 ENCOUNTER — Emergency Department (HOSPITAL_COMMUNITY)
Admission: EM | Admit: 2011-08-11 | Discharge: 2011-08-11 | Disposition: A | Discharge status: Home Health | Payer: Medicare Other | Attending: Emergency Medicine | Admitting: Emergency Medicine

## 2011-08-11 DIAGNOSIS — R0600 Dyspnea, unspecified: Secondary | ICD-10-CM

## 2011-08-11 DIAGNOSIS — R0989 Other specified symptoms and signs involving the circulatory and respiratory systems: Secondary | ICD-10-CM | POA: Insufficient documentation

## 2011-08-11 DIAGNOSIS — Z79899 Other long term (current) drug therapy: Secondary | ICD-10-CM | POA: Insufficient documentation

## 2011-08-11 DIAGNOSIS — R0609 Other forms of dyspnea: Secondary | ICD-10-CM | POA: Insufficient documentation

## 2011-08-11 DIAGNOSIS — I10 Essential (primary) hypertension: Secondary | ICD-10-CM | POA: Insufficient documentation

## 2011-08-11 DIAGNOSIS — Z86718 Personal history of other venous thrombosis and embolism: Secondary | ICD-10-CM | POA: Insufficient documentation

## 2011-08-11 DIAGNOSIS — R0602 Shortness of breath: Secondary | ICD-10-CM | POA: Insufficient documentation

## 2011-08-11 DIAGNOSIS — M79609 Pain in unspecified limb: Secondary | ICD-10-CM | POA: Insufficient documentation

## 2011-08-11 HISTORY — DX: Other pulmonary embolism without acute cor pulmonale: I26.99

## 2011-08-11 HISTORY — DX: Tachycardia, unspecified: R00.0

## 2011-08-11 HISTORY — DX: Essential (primary) hypertension: I10

## 2011-08-11 LAB — POCT I-STAT 3, VENOUS BLOOD GAS (G3P V)
pCO2, Ven: 51.4 mmHg — ABNORMAL HIGH (ref 45.0–50.0)
pH, Ven: 7.407 — ABNORMAL HIGH (ref 7.250–7.300)

## 2011-08-11 LAB — COMPREHENSIVE METABOLIC PANEL
Albumin: 3.5 g/dL (ref 3.5–5.2)
Alkaline Phosphatase: 86 U/L (ref 39–117)
BUN: 15 mg/dL (ref 6–23)
Calcium: 9.7 mg/dL (ref 8.4–10.5)
Creatinine, Ser: 0.7 mg/dL (ref 0.50–1.10)
GFR calc Af Amer: >90 mL/min (ref >90)
Glucose, Bld: 105 mg/dL — ABNORMAL HIGH (ref 70–99)
Potassium: 3.8 mEq/L (ref 3.5–5.1)
Total Protein: 7.1 g/dL (ref 6.0–8.3)

## 2011-08-11 LAB — CBC
HCT: 40.8 % (ref 36.0–46.0)
MCH: 29.2 pg (ref 26.0–34.0)
MCHC: 32.6 g/dL (ref 30.0–36.0)
RDW: 13.5 % (ref 11.5–15.5)

## 2011-08-11 LAB — TROPONIN I: Troponin I: <0.3 ng/mL (ref <0.30)

## 2011-08-11 LAB — PRO B NATRIURETIC PEPTIDE: Pro B Natriuretic peptide (BNP): 242.6 pg/mL — ABNORMAL HIGH (ref 0–125)

## 2011-08-11 LAB — D-DIMER, QUANTITATIVE: D-Dimer, Quant: 2.31 ug/mL-FEU — ABNORMAL HIGH (ref 0.00–0.48)

## 2011-08-11 MED ORDER — ALBUTEROL SULFATE (5 MG/ML) 0.5% IN NEBU
2.5000 mg | INHALATION_SOLUTION | RESPIRATORY_TRACT | Status: AC
  Administered 2011-08-11: 2.5 mg via RESPIRATORY_TRACT
  Filled 2011-08-11: qty 0.5

## 2011-08-11 MED ORDER — IOHEXOL 300 MG/ML  SOLN
80.0000 mL | Freq: Once | INTRAMUSCULAR | Status: AC | PRN
  Administered 2011-08-11: 80 mL via INTRAVENOUS

## 2011-08-11 MED ORDER — TRAMADOL HCL 50 MG PO TABS: 50.0000 mg | ORAL_TABLET | Freq: Four times a day (QID) | ORAL | Status: AC | PRN

## 2011-08-11 MED ORDER — KETOROLAC TROMETHAMINE 30 MG/ML IJ SOLN
30.0000 mg | Freq: Once | INTRAMUSCULAR | Status: AC
  Administered 2011-08-11: 30 mg via INTRAVENOUS
  Filled 2011-08-11: qty 1

## 2011-08-11 MED ORDER — IOHEXOL 300 MG/ML  SOLN
20.0000 mL | INTRAMUSCULAR | Status: DC
  Administered 2011-08-11: 20 mL via ORAL

## 2011-08-11 NOTE — ED Notes (Signed)
Pain improved now 3/10 achy left upper extremity pain.

## 2011-08-11 NOTE — Discharge Instructions (Signed)
It is very important that you speak with your physician tomorrow about today's presentation.  You have to discuss the elevated BNP - which likely requires an Echocardiogram for further evaluation.

## 2011-08-11 NOTE — ED Notes (Signed)
Abnormal labs where given to Dr. Lockwood 

## 2011-08-11 NOTE — ED Notes (Signed)
Onset of shortness of breath 2 weeks ago with left upper extremity pain. Seen at [facility] sent to ED for evaluation.  Ax4. Currently denies any pain at this time.

## 2011-08-11 NOTE — ED Provider Notes (Signed)
History     CSN: [id number]  Arrival date & time 08/11/11  1215   First MD Initiated Contact with Patient 08/11/11 1228      Chief Complaint  Patient presents with  . Shortness of Breath     HPI This obese elderly female presents with dyspnea, left arm pain.  Notably, the patient has a history of dyspnea for "years" as well as chronic intermittent left arm pain is similar to that which brings her in today.  She notes that over the past 3 days she is become gradually more dyspneic, both at rest and with exertion.  She notes some relief with albuterol.  Symptoms are definitely worse with exertion.  She denies significant pain beyond left shoulder pain which is present when exerting herself.  The patient has a distant history of PE, and the PT.  She's not currently anticoagulated.  She denies any syncope, near-syncope, vomiting, diarrhea, diaphoresis. Past Medical History  Diagnosis Date  . Hypertension   . Pulmonary embolism   . Tachycardia     History reviewed. No pertinent past surgical history.  No family history on file.  History  Substance Use Topics  . Smoking status: Not on file  . Smokeless tobacco: Not on file  . Alcohol Use:     OB History    Grav Para Term Preterm Abortions TAB SAB Ect Mult Living                  Review of Systems  Constitutional:       HPI  HENT:       HPI otherwise negative  Eyes: Negative.   Respiratory:       HPI, otherwise negative  Cardiovascular:       HPI, otherwise nmegative  Gastrointestinal: Negative for vomiting.  Genitourinary:       HPI, otherwise negative  Musculoskeletal:       HPI, otherwise negative  Skin: Negative.   Neurological: Negative for syncope.    Allergies  Coumadin  Home Medications   Current Outpatient Rx  Name Route Sig Dispense Refill  . ACETAMINOPHEN 500 MG PO TABS Oral Take 1,000 mg by mouth 2 (two) times daily.    Marland Kitchen CETIRIZINE HCL 10 MG PO TABS Oral Take 10 mg by mouth at bedtime.    Jeananne Rama SULFATE 0.05-0.25 % OP SOLN Both Eyes Place 2 drops into both eyes 2 (two) times daily.      BP 169/82  Pulse 98  Temp(Src) 98.1 F (36.7 C) (Oral)  Resp 20  SpO2 95%  Physical Exam  Nursing note and vitals reviewed. Constitutional: She is oriented to person, place, and time. She appears well-developed and well-nourished. No distress.  HENT:  Head: Normocephalic and atraumatic.  Eyes: Conjunctivae and EOM are normal.  Cardiovascular: Normal rate and regular rhythm.   Pulmonary/Chest: Effort normal. No stridor. No respiratory distress. She has decreased breath sounds. She has no rhonchi.  Abdominal: She exhibits no distension.  Musculoskeletal: She exhibits no edema.  Neurological: She is alert and oriented to person, place, and time. No cranial nerve deficit.  Skin: Skin is warm and dry.  Psychiatric: She has a normal mood and affect.    ED Course  Procedures (including critical care time)   Labs Reviewed  CBC  COMPREHENSIVE METABOLIC PANEL  TROPONIN I  D-DIMER, QUANTITATIVE  PRO B NATRIURETIC PEPTIDE  D-DIMER, QUANTITATIVE   No results found.   No diagnosis found. Pulse ox 97% 2l [employer] -  abnormal Cardiac: 85 sr, normal  XR reviewed by me   Date: 08/11/2011  Rate: 97  Rhythm: normal sinus rhythm  QRS Axis: normal  Intervals: normal  ST/T Wave abnormalities: nonspecific T wave changes  Conduction Disutrbances:none  Narrative Interpretation:   Old EKG Reviewed: unchanged  BORDERLINE ECG  MDM  The patient p/w worsening DOE.  Given her Hx of PE, there was concern for thromboembolic disease.  The non-ischemic ECG, the description of gradually worsening Sx is reassuring for the low probability of cardiac ischemia.  CTA negative, but there was a mild increase in BNP.  Given these findings,the patient was d/c to f/u with her PMD.  No diuretics today due to concern for renal injury.   Gerhard Munch, MD 08/11/11 [phone]

## 2011-08-30 DIAGNOSIS — R9439 Abnormal result of other cardiovascular function study: Secondary | ICD-10-CM | POA: Diagnosis present

## 2011-08-31 ENCOUNTER — Other Ambulatory Visit: Payer: Self-pay | Admitting: Cardiology

## 2011-09-01 ENCOUNTER — Ambulatory Visit (HOSPITAL_COMMUNITY)
Admission: RE | Admit: 2011-09-01 | Discharge: 2011-09-01 | Disposition: A | Discharge status: Home / Self Care | Payer: Medicare Other | Source: Ambulatory Visit | Attending: Cardiology | Admitting: Cardiology

## 2011-09-01 ENCOUNTER — Encounter (HOSPITAL_COMMUNITY): Payer: Self-pay | Admitting: Cardiology

## 2011-09-01 ENCOUNTER — Encounter (HOSPITAL_COMMUNITY)
Admission: RE | Disposition: A | Discharge status: Home / Self Care | Payer: Self-pay | Source: Ambulatory Visit | Attending: Cardiology

## 2011-09-01 DIAGNOSIS — E669 Obesity, unspecified: Secondary | ICD-10-CM | POA: Diagnosis present

## 2011-09-01 DIAGNOSIS — R0602 Shortness of breath: Secondary | ICD-10-CM | POA: Insufficient documentation

## 2011-09-01 DIAGNOSIS — J4489 Other specified chronic obstructive pulmonary disease: Secondary | ICD-10-CM | POA: Diagnosis present

## 2011-09-01 DIAGNOSIS — J449 Chronic obstructive pulmonary disease, unspecified: Secondary | ICD-10-CM | POA: Insufficient documentation

## 2011-09-01 DIAGNOSIS — R9439 Abnormal result of other cardiovascular function study: Secondary | ICD-10-CM | POA: Diagnosis present

## 2011-09-01 DIAGNOSIS — Z79899 Other long term (current) drug therapy: Secondary | ICD-10-CM | POA: Insufficient documentation

## 2011-09-01 DIAGNOSIS — Z6833 Body mass index (BMI) 33.0-33.9, adult: Secondary | ICD-10-CM | POA: Insufficient documentation

## 2011-09-01 DIAGNOSIS — Z86711 Personal history of pulmonary embolism: Secondary | ICD-10-CM | POA: Insufficient documentation

## 2011-09-01 DIAGNOSIS — I10 Essential (primary) hypertension: Secondary | ICD-10-CM | POA: Diagnosis present

## 2011-09-01 DIAGNOSIS — R0609 Other forms of dyspnea: Secondary | ICD-10-CM | POA: Diagnosis present

## 2011-09-01 DIAGNOSIS — I2699 Other pulmonary embolism without acute cor pulmonale: Secondary | ICD-10-CM | POA: Insufficient documentation

## 2011-09-01 DIAGNOSIS — I517 Cardiomegaly: Secondary | ICD-10-CM | POA: Insufficient documentation

## 2011-09-01 DIAGNOSIS — M19019 Primary osteoarthritis, unspecified shoulder: Secondary | ICD-10-CM | POA: Insufficient documentation

## 2011-09-01 DIAGNOSIS — E785 Hyperlipidemia, unspecified: Secondary | ICD-10-CM | POA: Diagnosis present

## 2011-09-01 HISTORY — DX: Obesity, unspecified: E66.9

## 2011-09-01 HISTORY — DX: Hyperlipidemia, unspecified: E78.5

## 2011-09-01 HISTORY — DX: Primary osteoarthritis, unspecified shoulder: M19.019

## 2011-09-01 HISTORY — DX: Other specified chronic obstructive pulmonary disease: J44.89

## 2011-09-01 HISTORY — DX: Chronic obstructive pulmonary disease, unspecified: J44.9

## 2011-09-01 HISTORY — PX: LEFT HEART CATHETERIZATION WITH CORONARY ANGIOGRAM: SHX5451

## 2011-09-01 LAB — CBC
HCT: 43.5 % (ref 36.0–46.0)
MCH: 29.8 pg (ref 26.0–34.0)
MCV: 91.2 fL (ref 78.0–100.0)
Platelets: 244 10*3/uL (ref 150–400)
RBC: 4.77 MIL/uL (ref 3.87–5.11)

## 2011-09-01 LAB — BASIC METABOLIC PANEL
BUN: 26 mg/dL — ABNORMAL HIGH (ref 6–23)
Creatinine, Ser: 0.64 mg/dL (ref 0.50–1.10)
GFR calc Af Amer: >90 mL/min (ref >90)
GFR calc non Af Amer: 86 mL/min — ABNORMAL LOW (ref >90)

## 2011-09-01 SURGERY — LEFT HEART CATHETERIZATION WITH CORONARY ANGIOGRAM
Anesthesia: LOCAL

## 2011-09-01 MED ORDER — ONDANSETRON HCL 4 MG/2ML IJ SOLN: 4.0000 mg | Freq: Four times a day (QID) | INTRAMUSCULAR | Status: DC | PRN

## 2011-09-01 MED ORDER — MIDAZOLAM HCL 2 MG/2ML IJ SOLN
INTRAMUSCULAR | Status: AC
  Filled 2011-09-01: qty 2

## 2011-09-01 MED ORDER — SODIUM CHLORIDE 0.9 % IV SOLN: 1.0000 mL/kg/h | INTRAVENOUS | Status: DC

## 2011-09-01 MED ORDER — ALBUTEROL SULFATE (5 MG/ML) 0.5% IN NEBU: 2.5000 mg | INHALATION_SOLUTION | Freq: Two times a day (BID) | RESPIRATORY_TRACT | Status: DC

## 2011-09-01 MED ORDER — ACETAMINOPHEN 500 MG PO TABS: 1000.0000 mg | ORAL_TABLET | Freq: Three times a day (TID) | ORAL | Status: DC | PRN

## 2011-09-01 MED ORDER — NITROGLYCERIN 0.2 MG/ML ON CALL CATH LAB
INTRAVENOUS | Status: AC
  Filled 2011-09-01: qty 1

## 2011-09-01 MED ORDER — ASPIRIN EC 81 MG PO TBEC: 81.0000 mg | DELAYED_RELEASE_TABLET | Freq: Every day | ORAL | Status: DC

## 2011-09-01 MED ORDER — HEPARIN (PORCINE) IN NACL 2-0.9 UNIT/ML-% IJ SOLN
INTRAMUSCULAR | Status: AC
  Filled 2011-09-01: qty 2000

## 2011-09-01 MED ORDER — FENTANYL CITRATE 0.05 MG/ML IJ SOLN
INTRAMUSCULAR | Status: AC
  Filled 2011-09-01: qty 2

## 2011-09-01 MED ORDER — SODIUM CHLORIDE 0.9 % IV SOLN
INTRAVENOUS | Status: DC
  Administered 2011-09-01: 10:00:00 via INTRAVENOUS

## 2011-09-01 MED ORDER — SODIUM CHLORIDE 0.9 % IV SOLN: 1.0000 mL/kg/h | INTRAVENOUS | Status: AC

## 2011-09-01 MED ORDER — NEBIVOLOL HCL 5 MG PO TABS: 5.0000 mg | ORAL_TABLET | Freq: Every day | ORAL | Status: DC

## 2011-09-01 MED ORDER — LIDOCAINE HCL (PF) 1 % IJ SOLN
INTRAMUSCULAR | Status: AC
  Filled 2011-09-01: qty 30

## 2011-09-01 MED ORDER — SODIUM CHLORIDE 0.9 % IJ SOLN: 3.0000 mL | INTRAMUSCULAR | Status: DC | PRN

## 2011-09-01 NOTE — Discharge Instructions (Signed)

## 2011-09-01 NOTE — H&P (Signed)
History and Physical Interval Note:  NAME:  Kristina Watson   MRN: [id number] DOB:  1935-12-29   ADMIT DATE: 09/01/2011   09/01/2011 9:42 AM  Ranell Finelli is a 76 y.o. female with PMH below who I saw for the first time on 08/17/2011 for shortness of breath on exertion that was evaluated with a Lexiscan Myoview on 3/26.   The Myoview was read as High Risk Abnormal -- medium sized, moderate-severe defect in the Apical Anterior & Apical wall without reversibility, also a mild-moderate defect with mild-moderate ischemia in the Mid Inferolateral & apical Lateral region. Echo: Mild to Moderate concentric LVH, impaired relaxation, moderate apical hypokinesis.  Mild-moderate AoV calcification (sclerosis without stenosis).   Past Medical History  Diagnosis Date  . Hypertension   . Pulmonary embolism   . Tachycardia   . Hyperlipidemia   . Obesity (BMI 35.0-39.9 without comorbidity)   . Asthma with COPD (chronic obstructive pulmonary disease)   . Osteoarthritis of shoulder    No past surgical history on file.  FAMHx: Family History  Problem Relation Age of Onset  . Cancer Mother     pancreatic  . Heart failure Brother     congenital heart disease  . Stroke Brother     SOCHx:  reports that she has never smoked. She does not have any smokeless tobacco history on file. Her alcohol and drug histories not on file.  ALLERGIES: Allergies  Allergen Reactions  . Coumadin Nausea And Vomiting    Bothers stomach    HOME MEDICATIONS: Prescriptions prior to admission  Medication Sig Dispense Refill  . acetaminophen (TYLENOL) 500 MG tablet Take 1,000 mg by mouth every 8 (eight) hours as needed. For pain      . albuterol (PROVENTIL) (2.5 MG/3ML) 0.083% nebulizer solution Take 2.5 mg by nebulization 2 (two) times daily.      Marland Kitchen aspirin EC 81 MG tablet Take 81 mg by mouth at bedtime.      . cetirizine (ZYRTEC) 10 MG tablet Take 10 mg by mouth at bedtime.      . nebivolol (BYSTOLIC) 5 MG tablet Take 5  mg by mouth at bedtime.      Marland Kitchen tetrahydrozoline-zinc (VISINE-AC) 0.05-0.25 % ophthalmic solution Place 2 drops into both eyes 3 (three) times daily as needed. For dry eyes        PHYSICAL EXAM:Blood pressure 135/76, pulse 75, temperature 98.1 F (36.7 C), temperature source Oral, resp. rate 18, height 5\' 8"  (1.727 m), weight 100.245 kg (221 lb), SpO2 91.00%. General appearance: alert, cooperative, appears stated age, no distress and moderately obese Neck: no adenopathy, no carotid bruit, no JVD, supple, symmetrical, trachea midline and thyroid not enlarged, symmetric, no tenderness/mass/nodules Lungs: rales LLL and no obvious wheezing Heart: regular rate and rhythm, S1, S2 normal, S4 present and systolic murmur: systolic ejection 1/6, crescendo, decrescendo and harsh at 2nd right intercostal space Abdomen: soft, non-tender; bowel sounds normal; no masses,  no organomegaly Extremities: extremities normal, atraumatic, no cyanosis or edema Pulses: diminished ~1+ radial & DP pulses bilaterally Neurologic: Grossly normal; noted Left eye "lazy" with lateral nystagmus.  IMPRESSION & PLAN The patients' history has been reviewed, patient examined, no change in status from most recent note, stable for surgery. I have reviewed the patients' chart and labs. Questions were answered to the patient's satisfaction.    Dalena Plantz has presented today for surgery, with the diagnosis of chest pain The various methods of treatment have been discussed with the patient and family.  Risks / Complications include, but not limited to: Death, MI, CVA/TIA, VF/VT (with defibrillation), Bradycardia (need for temporary pacer placement), contrast induced nephropathy, bleeding / bruising / hematoma / pseudoaneurysm, vascular or coronary injury (with possible emergent CT or Vascular Surgery), adverse medication reactions, infection.    After consideration of risks, benefits and other options for treatment, the patient has  consented to Procedure(s):  LEFT HEART CATHETERIZATION AND CORONARY ANGIOGRAPHY +/- AD HOC PERCUTANEOUS CORONARY INTERVENTION  as a surgical intervention.   We will proceed with the planned procedure.   Asahd Can W [facility] [address] [place], [place]  [address]  [phone]  09/01/2011 9:42 AM

## 2011-09-01 NOTE — CV Procedure (Signed)
[facility]     CARDIAC CATHETERIZATION REPORT  NAME:  Kristina Watson     MRN: [id number] DATE OF BIRTH:  [birth year]   ADMIT DATE:  09/01/2011  Performing Cardiologist: Marykay Lex, M.D., M.S. Primary Physician: Pearson Grippe, MD, MD Primary Cardiologist:  Marykay Lex, MD, MS  Procedures Performed:  Left Heart Catheterization via 5 Fr Right Common Femoral Artery access  Left Ventriculography, (RAO) 12 ml/sec for 25 ml total contrast  Native Coronary Angiography  Indication(s): Abnormal Myoview Stress Test  Shortness of breath on exertion  Hypertension  Hyperlipidemia  Obesity  History: 76 y.o. female PMH above who I saw for the first time on 08/17/2011 for shortness of breath on exertion that was evaluated with a Lexiscan Myoview on 3/26.  The Myoview was read as High Risk Abnormal -- medium sized, moderate-severe defect in the Apical Anterior & Apical wall without reversibility, also a mild-moderate defect with mild-moderate ischemia in the Mid Inferolateral & apical Lateral region.  Echo: Mild to Moderate concentric LVH, impaired relaxation, moderate apical hypokinesis. Mild-moderate AoV calcification (sclerosis without stenosis).  Consent: The procedure with Risks/Benefits/Alternatives and Indications was reviewed with the patient (and family).  All questions were answered.    Risks / Complications include, but not limited to: Death, MI, CVA/TIA, VF/VT (with defibrillation), Bradycardia (need for temporary pacer placement), contrast induced nephropathy, bleeding / bruising / hematoma / pseudoaneurysm, vascular or coronary injury (with possible emergent CT or Vascular Surgery), adverse medication reactions, infection.    The patient (and family) voice understanding and agree to proceed.    Risks of procedure as well as the alternatives and risks of each were explained to the (patient/caregiver).  Consent for procedure obtained. Consent for signed  by MD and patient with RN witness -- placed on chart.  Procedure: The patient was brought to the 2nd Floor [facility] in the fasting state and prepped and draped in the usual sterile fashion for Right groin access. Sterile technique was used including antiseptics, cap, gloves, gown, hand hygiene, mask and sheet.  Skin prep: Chlorhexidine;  Time Out: Verified patient identification, verified procedure, site/side was marked, verified correct patient position, special equipment/implants available, medications/allergies/relevent history reviewed, required imaging and test results available.  Performed  The right femoral head was identified using tactile and fluoroscopic technique.  The right groin was anesthetized with 1% subcutaneous Lidocaine.  The right Common Femoral Artery was accessed using the Modified Seldinger Technique with placement of a antimicrobial bonded/coated single lumen 5 Fr sheath.  The sheath was aspirated and flushed.    A 5 [place] JL4 followed by a 5% Fr JR 4 Catheter were advanced of over a  Standard J wire into the ascending Aorta, and used to engage first the Left and then Right Coronary Artery.  Multiple cineangiographic views of both Coronary Artery system(s) were performed.   The  JR 4catheter was then exchanged over the  standard J wire for an angled Pigtail catheter that was advanced across the Aortic Valve.  LV hemodynamics were measured and Left Ventriculography was performed.  LV hemodynamics were then re-sampled, and the catheter was pulled back across the Aortic Valve for measurement of "pull-back" gradient.  The catheter and wire  wereremoved completely out of the body.  The patient was transferred to the holding area where the sheath was removed with manual pressure held for hemostasis.    Hemodynamics:  Central Aortic Pressure / Mean Aortic Pressure: 123/66 mmHg ; 89  mmHg  LV Pressure / LV End diastolic Pressure:  123/6 mmHg ; 11  mmHg  Left Ventriculography:  EF:  55%  Wall Motion:  No obvious wall motion abnormalities however there does appear in mild mitral prolapse  Coronary Angiographic Data:  Left Main:  Large caliber vessel that bifurcates into the LAD and Left Circumflex  Left Anterior Descending (LAD):  Large large caliber extremely tortuous vessel that reaches around the apex. There are several sharp turns in this vessel.  The vessel itself angiographic normal in tapers down to a small vessel the apex. There is a small caliber first diagonal branch that comes minimal area. The major diagonal branch is the second diagonal branch, here is a smaller third diagonal branch which is intermittently normal. All vessels were extremely tortuous  2nd diagonal (D2):  This is the major branch vessel it branches distally with several smaller branches. Extremely tortuous vessel  3rd diagonal (D3):  Small caliber very tortuous vessel branches in the distal antero-lateral wall.  Circumflex (LCx):  Large caliber vessel which takes a very circuitous route and he successfully courses into a ramus intermedius / high obtuse marginal distribution. Is a large-caliber vessel is very tortuous.  There is no circumflex artery in the along atrioventricular groove  Right Coronary Artery:  large dominant vessel, angiographicaly normal;   right ventricle branch of right coronary artery:  A proximal and mid artery marginal branch and is noted. These are at least the same diameter as D3.  posterior descending artery:  Moderate caliber very tortuous vessel with extensive distribution, there is a proximal branch is smaller in diameter which also courses in the interventricular groove; angiographically normal  posterior Atrioventricular Groove: This is a moderate to large caliber vessel it gives rise to 3 major posterior lateral branches this most distal limb actually courses in the left atrioventricular groove NPL 3 would almost be in a OM  distribution.   Postero-lateral branch:  The major posterolateral branches at the PL1 is again very tortuous moderate caliber vessel; PL 2 is slightly smaller caliber but also tortuous; otherwise angiographically no luminal irregularities  The patient was transported to the  PACU holding area in  hemodynamically stable, chest pain-free condition.   The patient  was stable before, during and following the procedure.   Patient did tolerate procedure well. There were not complications.  EBL: < 10 mL  Medications:  Premedication:  5mg   Valium,  Sedation:  1 mg IV Versed,  25 IV mcg Fentanyl  Contrast:   75 mL  Omnipaque   Impression:  Extremely tortuous but otherwise angiographically normal coronary arteries with no evidence of significant coronary disease. As it bradycardia right dominant system with a circumflex only covering a high obtuse marginal is a vision the remainder being covered by the RCA this region.   Because coronary artery suggestive of long-standing hypertension. This would suggest diastolic dysfunction as a possible etiology for her dyspnea on exertion  No angiographic evidence of asymmetric lesion to explain the abnormal stress test, indicating a False Positive LexiScan Myoview.  Normal left ventricular function with normal EP now that she is on blood pressure management.  Plan:  Standard post-cath care and anticipate discharge this afternoon.  Continue with current antihypertensive regimen. She will need refills of Bystolic been seen in followup.    I will see her her back in followup in roughly 2 weeks.  The case and results was discussed with the patient (and family). The case and results was  discussed with the patient's PCP. The case and results was discussed with the patient's Cardiologist.  Time Spend Directly with Patient:  30 minutes  Alishea Beaudin W, M.D., M.S. [facility] [address] [place], [place]   [address]  [phone]

## 2013-04-05 ENCOUNTER — Other Ambulatory Visit: Payer: Self-pay | Admitting: Cardiology

## 2013-04-05 NOTE — Telephone Encounter (Signed)
Rx was sent to pharmacy electronically. 

## 2013-04-10 ENCOUNTER — Other Ambulatory Visit: Payer: Self-pay

## 2013-04-10 MED ORDER — NEBIVOLOL HCL 10 MG PO TABS: ORAL_TABLET | ORAL | Status: DC

## 2013-04-10 NOTE — Telephone Encounter (Signed)
Rx was sent to pharmacy electronically. 

## 2013-04-30 ENCOUNTER — Other Ambulatory Visit: Payer: Self-pay | Admitting: Cardiology

## 2013-05-27 ENCOUNTER — Other Ambulatory Visit (HOSPITAL_COMMUNITY): Payer: Self-pay | Admitting: Urology

## 2013-05-27 DIAGNOSIS — D49519 Neoplasm of unspecified behavior of unspecified kidney: Secondary | ICD-10-CM

## 2013-06-10 ENCOUNTER — Ambulatory Visit (HOSPITAL_COMMUNITY)
Admission: RE | Admit: 2013-06-10 | Discharge: 2013-06-10 | Disposition: A | Discharge status: Home / Self Care | Payer: Medicare Other | Source: Ambulatory Visit | Attending: Urology | Admitting: Urology

## 2013-06-10 DIAGNOSIS — D49519 Neoplasm of unspecified behavior of unspecified kidney: Secondary | ICD-10-CM

## 2013-06-20 ENCOUNTER — Ambulatory Visit
Admission: RE | Admit: 2013-06-20 | Discharge: 2013-06-20 | Disposition: A | Discharge status: Home / Self Care | Payer: Medicare Other | Source: Ambulatory Visit | Attending: Urology | Admitting: Urology

## 2013-06-20 ENCOUNTER — Ambulatory Visit: Admission: RE | Admit: 2013-06-20 | Payer: Medicare Other | Source: Ambulatory Visit

## 2013-06-20 ENCOUNTER — Other Ambulatory Visit: Payer: Self-pay | Admitting: Urology

## 2013-06-20 DIAGNOSIS — D49519 Neoplasm of unspecified behavior of unspecified kidney: Secondary | ICD-10-CM

## 2013-06-20 MED ORDER — GADOBENATE DIMEGLUMINE 529 MG/ML IV SOLN
20.0000 mL | Freq: Once | INTRAVENOUS | Status: AC | PRN
  Administered 2013-06-20: 20 mL via INTRAVENOUS

## 2013-06-25 ENCOUNTER — Other Ambulatory Visit: Payer: Self-pay | Admitting: Cardiology

## 2014-04-25 ENCOUNTER — Institutional Professional Consult (permissible substitution): Payer: Medicare Other | Admitting: Internal Medicine

## 2014-05-15 ENCOUNTER — Encounter (HOSPITAL_COMMUNITY): Payer: Self-pay | Admitting: Cardiology

## 2014-06-29 DIAGNOSIS — J45909 Unspecified asthma, uncomplicated: Secondary | ICD-10-CM | POA: Diagnosis not present

## 2014-07-01 DIAGNOSIS — J45998 Other asthma: Secondary | ICD-10-CM | POA: Diagnosis not present

## 2014-07-16 DIAGNOSIS — E785 Hyperlipidemia, unspecified: Secondary | ICD-10-CM | POA: Diagnosis not present

## 2014-07-30 DIAGNOSIS — J45909 Unspecified asthma, uncomplicated: Secondary | ICD-10-CM | POA: Diagnosis not present

## 2014-07-31 DIAGNOSIS — J45998 Other asthma: Secondary | ICD-10-CM | POA: Diagnosis not present

## 2014-08-25 DIAGNOSIS — J45998 Other asthma: Secondary | ICD-10-CM | POA: Diagnosis not present

## 2014-08-28 DIAGNOSIS — J45909 Unspecified asthma, uncomplicated: Secondary | ICD-10-CM | POA: Diagnosis not present

## 2014-09-10 DIAGNOSIS — J45998 Other asthma: Secondary | ICD-10-CM | POA: Diagnosis not present

## 2014-09-28 DIAGNOSIS — J45909 Unspecified asthma, uncomplicated: Secondary | ICD-10-CM | POA: Diagnosis not present

## 2014-10-21 DIAGNOSIS — N390 Urinary tract infection, site not specified: Secondary | ICD-10-CM | POA: Diagnosis not present

## 2014-10-28 DIAGNOSIS — J45909 Unspecified asthma, uncomplicated: Secondary | ICD-10-CM | POA: Diagnosis not present

## 2014-10-31 DIAGNOSIS — N390 Urinary tract infection, site not specified: Secondary | ICD-10-CM | POA: Diagnosis not present

## 2014-10-31 DIAGNOSIS — R300 Dysuria: Secondary | ICD-10-CM | POA: Diagnosis not present

## 2014-11-05 DIAGNOSIS — J45998 Other asthma: Secondary | ICD-10-CM | POA: Diagnosis not present

## 2014-11-28 DIAGNOSIS — J45909 Unspecified asthma, uncomplicated: Secondary | ICD-10-CM | POA: Diagnosis not present

## 2014-12-10 DIAGNOSIS — J45998 Other asthma: Secondary | ICD-10-CM | POA: Diagnosis not present

## 2014-12-16 DIAGNOSIS — N390 Urinary tract infection, site not specified: Secondary | ICD-10-CM | POA: Diagnosis not present

## 2014-12-16 DIAGNOSIS — R300 Dysuria: Secondary | ICD-10-CM | POA: Diagnosis not present

## 2014-12-16 DIAGNOSIS — N3020 Other chronic cystitis without hematuria: Secondary | ICD-10-CM | POA: Diagnosis not present

## 2014-12-22 DIAGNOSIS — R300 Dysuria: Secondary | ICD-10-CM | POA: Diagnosis not present

## 2014-12-22 DIAGNOSIS — M62838 Other muscle spasm: Secondary | ICD-10-CM | POA: Diagnosis not present

## 2014-12-22 DIAGNOSIS — R278 Other lack of coordination: Secondary | ICD-10-CM | POA: Diagnosis not present

## 2014-12-22 DIAGNOSIS — N3020 Other chronic cystitis without hematuria: Secondary | ICD-10-CM | POA: Diagnosis not present

## 2014-12-28 DIAGNOSIS — J45909 Unspecified asthma, uncomplicated: Secondary | ICD-10-CM | POA: Diagnosis not present

## 2014-12-31 DIAGNOSIS — L0390 Cellulitis, unspecified: Secondary | ICD-10-CM | POA: Diagnosis not present

## 2014-12-31 DIAGNOSIS — M62838 Other muscle spasm: Secondary | ICD-10-CM | POA: Diagnosis not present

## 2014-12-31 DIAGNOSIS — L02214 Cutaneous abscess of groin: Secondary | ICD-10-CM | POA: Diagnosis not present

## 2014-12-31 DIAGNOSIS — R278 Other lack of coordination: Secondary | ICD-10-CM | POA: Diagnosis not present

## 2015-01-02 DIAGNOSIS — L02214 Cutaneous abscess of groin: Secondary | ICD-10-CM | POA: Diagnosis not present

## 2015-01-05 DIAGNOSIS — J45998 Other asthma: Secondary | ICD-10-CM | POA: Diagnosis not present

## 2015-01-06 DIAGNOSIS — R300 Dysuria: Secondary | ICD-10-CM | POA: Diagnosis not present

## 2015-01-06 DIAGNOSIS — M62838 Other muscle spasm: Secondary | ICD-10-CM | POA: Diagnosis not present

## 2015-01-06 DIAGNOSIS — R278 Other lack of coordination: Secondary | ICD-10-CM | POA: Diagnosis not present

## 2015-01-06 DIAGNOSIS — N3020 Other chronic cystitis without hematuria: Secondary | ICD-10-CM | POA: Diagnosis not present

## 2015-01-13 DIAGNOSIS — M62838 Other muscle spasm: Secondary | ICD-10-CM | POA: Diagnosis not present

## 2015-01-13 DIAGNOSIS — R278 Other lack of coordination: Secondary | ICD-10-CM | POA: Diagnosis not present

## 2015-01-13 DIAGNOSIS — R300 Dysuria: Secondary | ICD-10-CM | POA: Diagnosis not present

## 2015-01-13 DIAGNOSIS — N3020 Other chronic cystitis without hematuria: Secondary | ICD-10-CM | POA: Diagnosis not present

## 2015-01-27 DIAGNOSIS — M62838 Other muscle spasm: Secondary | ICD-10-CM | POA: Diagnosis not present

## 2015-01-27 DIAGNOSIS — R300 Dysuria: Secondary | ICD-10-CM | POA: Diagnosis not present

## 2015-01-27 DIAGNOSIS — N3020 Other chronic cystitis without hematuria: Secondary | ICD-10-CM | POA: Diagnosis not present

## 2015-01-27 DIAGNOSIS — R278 Other lack of coordination: Secondary | ICD-10-CM | POA: Diagnosis not present

## 2015-01-28 DIAGNOSIS — J45909 Unspecified asthma, uncomplicated: Secondary | ICD-10-CM | POA: Diagnosis not present

## 2015-02-04 DIAGNOSIS — R300 Dysuria: Secondary | ICD-10-CM | POA: Diagnosis not present

## 2015-02-04 DIAGNOSIS — R278 Other lack of coordination: Secondary | ICD-10-CM | POA: Diagnosis not present

## 2015-02-04 DIAGNOSIS — N3020 Other chronic cystitis without hematuria: Secondary | ICD-10-CM | POA: Diagnosis not present

## 2015-02-04 DIAGNOSIS — M62838 Other muscle spasm: Secondary | ICD-10-CM | POA: Diagnosis not present

## 2015-02-13 DIAGNOSIS — R278 Other lack of coordination: Secondary | ICD-10-CM | POA: Diagnosis not present

## 2015-02-13 DIAGNOSIS — R300 Dysuria: Secondary | ICD-10-CM | POA: Diagnosis not present

## 2015-02-13 DIAGNOSIS — M62838 Other muscle spasm: Secondary | ICD-10-CM | POA: Diagnosis not present

## 2015-02-13 DIAGNOSIS — N3020 Other chronic cystitis without hematuria: Secondary | ICD-10-CM | POA: Diagnosis not present

## 2015-02-28 DIAGNOSIS — J45909 Unspecified asthma, uncomplicated: Secondary | ICD-10-CM | POA: Diagnosis not present

## 2015-03-16 DIAGNOSIS — J45998 Other asthma: Secondary | ICD-10-CM | POA: Diagnosis not present

## 2015-03-30 DIAGNOSIS — J45909 Unspecified asthma, uncomplicated: Secondary | ICD-10-CM | POA: Diagnosis not present

## 2015-04-30 DIAGNOSIS — J45909 Unspecified asthma, uncomplicated: Secondary | ICD-10-CM | POA: Diagnosis not present

## 2015-05-05 DIAGNOSIS — J45998 Other asthma: Secondary | ICD-10-CM | POA: Diagnosis not present

## 2015-05-28 DIAGNOSIS — J45998 Other asthma: Secondary | ICD-10-CM | POA: Diagnosis not present

## 2015-05-30 DIAGNOSIS — J45909 Unspecified asthma, uncomplicated: Secondary | ICD-10-CM | POA: Diagnosis not present

## 2015-06-30 DIAGNOSIS — J45909 Unspecified asthma, uncomplicated: Secondary | ICD-10-CM | POA: Diagnosis not present

## 2015-07-03 DIAGNOSIS — I10 Essential (primary) hypertension: Secondary | ICD-10-CM | POA: Diagnosis not present

## 2015-07-03 DIAGNOSIS — Z780 Asymptomatic menopausal state: Secondary | ICD-10-CM | POA: Diagnosis not present

## 2015-07-03 DIAGNOSIS — Z0000 Encounter for general adult medical examination without abnormal findings: Secondary | ICD-10-CM | POA: Diagnosis not present

## 2015-07-27 DIAGNOSIS — Z79899 Other long term (current) drug therapy: Secondary | ICD-10-CM | POA: Diagnosis not present

## 2015-07-27 DIAGNOSIS — J45998 Other asthma: Secondary | ICD-10-CM | POA: Diagnosis not present

## 2015-07-27 DIAGNOSIS — I10 Essential (primary) hypertension: Secondary | ICD-10-CM | POA: Diagnosis not present

## 2015-07-27 DIAGNOSIS — E785 Hyperlipidemia, unspecified: Secondary | ICD-10-CM | POA: Diagnosis not present

## 2015-07-30 DIAGNOSIS — E559 Vitamin D deficiency, unspecified: Secondary | ICD-10-CM | POA: Diagnosis not present

## 2015-07-30 DIAGNOSIS — I10 Essential (primary) hypertension: Secondary | ICD-10-CM | POA: Diagnosis not present

## 2015-07-31 DIAGNOSIS — J45909 Unspecified asthma, uncomplicated: Secondary | ICD-10-CM | POA: Diagnosis not present

## 2015-08-21 DIAGNOSIS — J45998 Other asthma: Secondary | ICD-10-CM | POA: Diagnosis not present

## 2015-08-28 DIAGNOSIS — J45909 Unspecified asthma, uncomplicated: Secondary | ICD-10-CM | POA: Diagnosis not present

## 2015-09-16 DIAGNOSIS — J45998 Other asthma: Secondary | ICD-10-CM | POA: Diagnosis not present

## 2015-09-28 DIAGNOSIS — J45909 Unspecified asthma, uncomplicated: Secondary | ICD-10-CM | POA: Diagnosis not present

## 2015-10-14 DIAGNOSIS — J45998 Other asthma: Secondary | ICD-10-CM | POA: Diagnosis not present

## 2015-10-28 DIAGNOSIS — J45909 Unspecified asthma, uncomplicated: Secondary | ICD-10-CM | POA: Diagnosis not present

## 2015-11-28 DIAGNOSIS — J45909 Unspecified asthma, uncomplicated: Secondary | ICD-10-CM | POA: Diagnosis not present

## 2015-12-02 DIAGNOSIS — I10 Essential (primary) hypertension: Secondary | ICD-10-CM | POA: Diagnosis not present

## 2015-12-02 DIAGNOSIS — R739 Hyperglycemia, unspecified: Secondary | ICD-10-CM | POA: Diagnosis not present

## 2015-12-02 DIAGNOSIS — R040 Epistaxis: Secondary | ICD-10-CM | POA: Diagnosis not present

## 2015-12-02 DIAGNOSIS — Z780 Asymptomatic menopausal state: Secondary | ICD-10-CM | POA: Diagnosis not present

## 2015-12-28 DIAGNOSIS — J45909 Unspecified asthma, uncomplicated: Secondary | ICD-10-CM | POA: Diagnosis not present

## 2016-01-04 DIAGNOSIS — J45998 Other asthma: Secondary | ICD-10-CM | POA: Diagnosis not present

## 2016-01-28 DIAGNOSIS — J45909 Unspecified asthma, uncomplicated: Secondary | ICD-10-CM | POA: Diagnosis not present

## 2016-02-28 DIAGNOSIS — J45909 Unspecified asthma, uncomplicated: Secondary | ICD-10-CM | POA: Diagnosis not present

## 2016-02-29 DIAGNOSIS — J45998 Other asthma: Secondary | ICD-10-CM | POA: Diagnosis not present

## 2016-03-24 DIAGNOSIS — J45998 Other asthma: Secondary | ICD-10-CM | POA: Diagnosis not present

## 2016-03-29 DIAGNOSIS — J45909 Unspecified asthma, uncomplicated: Secondary | ICD-10-CM | POA: Diagnosis not present

## 2016-04-26 DIAGNOSIS — J45998 Other asthma: Secondary | ICD-10-CM | POA: Diagnosis not present

## 2016-04-29 DIAGNOSIS — J45909 Unspecified asthma, uncomplicated: Secondary | ICD-10-CM | POA: Diagnosis not present

## 2016-05-25 DIAGNOSIS — J45998 Other asthma: Secondary | ICD-10-CM | POA: Diagnosis not present

## 2016-05-29 DIAGNOSIS — J45909 Unspecified asthma, uncomplicated: Secondary | ICD-10-CM | POA: Diagnosis not present

## 2016-06-29 DIAGNOSIS — J45909 Unspecified asthma, uncomplicated: Secondary | ICD-10-CM | POA: Diagnosis not present

## 2016-07-30 DIAGNOSIS — J45909 Unspecified asthma, uncomplicated: Secondary | ICD-10-CM | POA: Diagnosis not present

## 2016-08-01 DIAGNOSIS — J45998 Other asthma: Secondary | ICD-10-CM | POA: Diagnosis not present

## 2016-08-27 DIAGNOSIS — J45909 Unspecified asthma, uncomplicated: Secondary | ICD-10-CM | POA: Diagnosis not present

## 2016-08-29 DIAGNOSIS — J45998 Other asthma: Secondary | ICD-10-CM | POA: Diagnosis not present

## 2016-09-22 DIAGNOSIS — J45998 Other asthma: Secondary | ICD-10-CM | POA: Diagnosis not present

## 2016-09-27 DIAGNOSIS — J45909 Unspecified asthma, uncomplicated: Secondary | ICD-10-CM | POA: Diagnosis not present

## 2016-10-18 DIAGNOSIS — J45998 Other asthma: Secondary | ICD-10-CM | POA: Diagnosis not present

## 2016-10-27 DIAGNOSIS — J45909 Unspecified asthma, uncomplicated: Secondary | ICD-10-CM | POA: Diagnosis not present

## 2016-11-14 DIAGNOSIS — J45998 Other asthma: Secondary | ICD-10-CM | POA: Diagnosis not present

## 2016-11-27 DIAGNOSIS — J45909 Unspecified asthma, uncomplicated: Secondary | ICD-10-CM | POA: Diagnosis not present

## 2016-12-20 DIAGNOSIS — I10 Essential (primary) hypertension: Secondary | ICD-10-CM | POA: Diagnosis not present

## 2016-12-20 DIAGNOSIS — R739 Hyperglycemia, unspecified: Secondary | ICD-10-CM | POA: Diagnosis not present

## 2016-12-20 DIAGNOSIS — R42 Dizziness and giddiness: Secondary | ICD-10-CM | POA: Diagnosis not present

## 2016-12-20 DIAGNOSIS — E559 Vitamin D deficiency, unspecified: Secondary | ICD-10-CM | POA: Diagnosis not present

## 2016-12-27 DIAGNOSIS — J45909 Unspecified asthma, uncomplicated: Secondary | ICD-10-CM | POA: Diagnosis not present

## 2016-12-28 DIAGNOSIS — J45998 Other asthma: Secondary | ICD-10-CM | POA: Diagnosis not present

## 2016-12-30 ENCOUNTER — Ambulatory Visit: Payer: Medicare Other | Admitting: Neurology

## 2017-01-05 DIAGNOSIS — J301 Allergic rhinitis due to pollen: Secondary | ICD-10-CM | POA: Diagnosis not present

## 2017-01-05 DIAGNOSIS — M79605 Pain in left leg: Secondary | ICD-10-CM | POA: Diagnosis not present

## 2017-01-05 DIAGNOSIS — R232 Flushing: Secondary | ICD-10-CM | POA: Diagnosis not present

## 2017-01-05 DIAGNOSIS — R739 Hyperglycemia, unspecified: Secondary | ICD-10-CM | POA: Diagnosis not present

## 2017-01-05 DIAGNOSIS — M79604 Pain in right leg: Secondary | ICD-10-CM | POA: Diagnosis not present

## 2017-01-05 DIAGNOSIS — E559 Vitamin D deficiency, unspecified: Secondary | ICD-10-CM | POA: Diagnosis not present

## 2017-01-05 DIAGNOSIS — I10 Essential (primary) hypertension: Secondary | ICD-10-CM | POA: Diagnosis not present

## 2017-01-09 ENCOUNTER — Encounter (HOSPITAL_COMMUNITY): Payer: Self-pay | Admitting: *Deleted

## 2017-01-09 ENCOUNTER — Emergency Department (HOSPITAL_COMMUNITY): Payer: Medicare Other

## 2017-01-09 ENCOUNTER — Inpatient Hospital Stay (HOSPITAL_COMMUNITY)
Admission: EM | Admit: 2017-01-09 | Discharge: 2017-01-13 | DRG: 190 | Disposition: A | Discharge status: Home / Self Care | Payer: Medicare Other | Attending: Internal Medicine | Admitting: Internal Medicine

## 2017-01-09 DIAGNOSIS — E876 Hypokalemia: Secondary | ICD-10-CM | POA: Diagnosis present

## 2017-01-09 DIAGNOSIS — J9621 Acute and chronic respiratory failure with hypoxia: Secondary | ICD-10-CM | POA: Diagnosis not present

## 2017-01-09 DIAGNOSIS — Z823 Family history of stroke: Secondary | ICD-10-CM | POA: Diagnosis not present

## 2017-01-09 DIAGNOSIS — E785 Hyperlipidemia, unspecified: Secondary | ICD-10-CM

## 2017-01-09 DIAGNOSIS — Z9981 Dependence on supplemental oxygen: Secondary | ICD-10-CM | POA: Diagnosis not present

## 2017-01-09 DIAGNOSIS — Z8249 Family history of ischemic heart disease and other diseases of the circulatory system: Secondary | ICD-10-CM

## 2017-01-09 DIAGNOSIS — I472 Ventricular tachycardia: Secondary | ICD-10-CM | POA: Diagnosis present

## 2017-01-09 DIAGNOSIS — R0602 Shortness of breath: Secondary | ICD-10-CM | POA: Diagnosis not present

## 2017-01-09 DIAGNOSIS — Z800 Family history of malignant neoplasm of digestive organs: Secondary | ICD-10-CM | POA: Diagnosis not present

## 2017-01-09 DIAGNOSIS — Z86711 Personal history of pulmonary embolism: Secondary | ICD-10-CM

## 2017-01-09 DIAGNOSIS — Z6834 Body mass index (BMI) 34.0-34.9, adult: Secondary | ICD-10-CM

## 2017-01-09 DIAGNOSIS — E669 Obesity, unspecified: Secondary | ICD-10-CM | POA: Diagnosis present

## 2017-01-09 DIAGNOSIS — R069 Unspecified abnormalities of breathing: Secondary | ICD-10-CM | POA: Diagnosis not present

## 2017-01-09 DIAGNOSIS — R627 Adult failure to thrive: Secondary | ICD-10-CM | POA: Diagnosis not present

## 2017-01-09 DIAGNOSIS — J441 Chronic obstructive pulmonary disease with (acute) exacerbation: Principal | ICD-10-CM

## 2017-01-09 DIAGNOSIS — I10 Essential (primary) hypertension: Secondary | ICD-10-CM

## 2017-01-09 DIAGNOSIS — I2699 Other pulmonary embolism without acute cor pulmonale: Secondary | ICD-10-CM | POA: Diagnosis present

## 2017-01-09 DIAGNOSIS — R0609 Other forms of dyspnea: Secondary | ICD-10-CM

## 2017-01-09 DIAGNOSIS — J449 Chronic obstructive pulmonary disease, unspecified: Secondary | ICD-10-CM | POA: Diagnosis present

## 2017-01-09 HISTORY — DX: Dependence on supplemental oxygen: Z99.81

## 2017-01-09 HISTORY — DX: Pneumonia, unspecified organism: J18.9

## 2017-01-09 HISTORY — DX: Chronic obstructive pulmonary disease, unspecified: J44.9

## 2017-01-09 HISTORY — DX: Unspecified chronic bronchitis: J42

## 2017-01-09 HISTORY — DX: Unspecified osteoarthritis, unspecified site: M19.90

## 2017-01-09 HISTORY — DX: Other allergy status, other than to drugs and biological substances: Z91.09

## 2017-01-09 LAB — TROPONIN I: Troponin I: <0.03 ng/mL (ref <0.03)

## 2017-01-09 LAB — CBC WITH DIFFERENTIAL/PLATELET
BASOS PCT: 0 %
Basophils Absolute: 0 10*3/uL (ref 0.0–0.1)
Eosinophils Absolute: 0.2 10*3/uL (ref 0.0–0.7)
Eosinophils Relative: 2 %
HEMATOCRIT: 43.3 % (ref 36.0–46.0)
HEMOGLOBIN: 14.3 g/dL (ref 12.0–15.0)
LYMPHS ABS: 3.4 10*3/uL (ref 0.7–4.0)
Lymphocytes Relative: 43 %
MCH: 30.6 pg (ref 26.0–34.0)
MCHC: 33 g/dL (ref 30.0–36.0)
MCV: 92.5 fL (ref 78.0–100.0)
MONOS PCT: 6 %
Monocytes Absolute: 0.5 10*3/uL (ref 0.1–1.0)
NEUTROS ABS: 3.9 10*3/uL (ref 1.7–7.7)
NEUTROS PCT: 49 %
Platelets: 284 10*3/uL (ref 150–400)
RBC: 4.68 MIL/uL (ref 3.87–5.11)
RDW: 13.4 % (ref 11.5–15.5)
WBC: 8 10*3/uL (ref 4.0–10.5)

## 2017-01-09 LAB — BASIC METABOLIC PANEL
ANION GAP: 11 (ref 5–15)
BUN: 17 mg/dL (ref 6–20)
CALCIUM: 9.5 mg/dL (ref 8.9–10.3)
CHLORIDE: 103 mmol/L (ref 101–111)
CO2: 27 mmol/L (ref 22–32)
Creatinine, Ser: 0.7 mg/dL (ref 0.44–1.00)
GFR calc non Af Amer: >60 mL/min (ref >60)
Glucose, Bld: 138 mg/dL — ABNORMAL HIGH (ref 65–99)
Potassium: 3.2 mmol/L — ABNORMAL LOW (ref 3.5–5.1)
Sodium: 141 mmol/L (ref 135–145)

## 2017-01-09 LAB — BRAIN NATRIURETIC PEPTIDE: B Natriuretic Peptide: 118.3 pg/mL — ABNORMAL HIGH (ref 0.0–100.0)

## 2017-01-09 LAB — D-DIMER, QUANTITATIVE (NOT AT ARMC): D DIMER QUANT: 2.34 ug{FEU}/mL — AB (ref 0.00–0.50)

## 2017-01-09 MED ORDER — NITROGLYCERIN 2 % TD OINT
1.0000 [in_us] | TOPICAL_OINTMENT | Freq: Once | TRANSDERMAL | Status: AC
  Administered 2017-01-09: 1 [in_us] via TOPICAL
  Filled 2017-01-09: qty 1

## 2017-01-09 MED ORDER — IPRATROPIUM-ALBUTEROL 0.5-2.5 (3) MG/3ML IN SOLN
5.0000 mL | Freq: Once | RESPIRATORY_TRACT | Status: AC
  Administered 2017-01-09: 6 mL via RESPIRATORY_TRACT
  Filled 2017-01-09: qty 3

## 2017-01-09 MED ORDER — ONDANSETRON HCL 4 MG/2ML IJ SOLN: 4.0000 mg | Freq: Four times a day (QID) | INTRAMUSCULAR | Status: DC | PRN

## 2017-01-09 MED ORDER — METHYLPREDNISOLONE SODIUM SUCC 125 MG IJ SOLR
60.0000 mg | Freq: Two times a day (BID) | INTRAMUSCULAR | Status: DC
  Administered 2017-01-09 – 2017-01-12 (×7): 60 mg via INTRAVENOUS
  Filled 2017-01-09 (×7): qty 2

## 2017-01-09 MED ORDER — HYDRALAZINE HCL 20 MG/ML IJ SOLN: 5.0000 mg | Freq: Three times a day (TID) | INTRAMUSCULAR | Status: DC | PRN

## 2017-01-09 MED ORDER — ONDANSETRON HCL 4 MG PO TABS: 4.0000 mg | ORAL_TABLET | Freq: Four times a day (QID) | ORAL | Status: DC | PRN

## 2017-01-09 MED ORDER — IPRATROPIUM-ALBUTEROL 0.5-2.5 (3) MG/3ML IN SOLN
3.0000 mL | Freq: Three times a day (TID) | RESPIRATORY_TRACT | Status: DC
  Administered 2017-01-10 – 2017-01-13 (×10): 3 mL via RESPIRATORY_TRACT
  Filled 2017-01-09 (×10): qty 3

## 2017-01-09 MED ORDER — ALBUTEROL SULFATE (2.5 MG/3ML) 0.083% IN NEBU: 2.5000 mg | INHALATION_SOLUTION | RESPIRATORY_TRACT | Status: DC | PRN

## 2017-01-09 MED ORDER — SODIUM CHLORIDE 0.9 % IV BOLUS (SEPSIS): 500.0000 mL | Freq: Once | INTRAVENOUS | Status: DC

## 2017-01-09 MED ORDER — DM-GUAIFENESIN ER 30-600 MG PO TB12
1.0000 | ORAL_TABLET | Freq: Two times a day (BID) | ORAL | Status: DC
  Administered 2017-01-09 – 2017-01-10 (×3): 1 via ORAL
  Filled 2017-01-09 (×3): qty 1

## 2017-01-09 MED ORDER — SENNOSIDES-DOCUSATE SODIUM 8.6-50 MG PO TABS: 1.0000 | ORAL_TABLET | Freq: Every evening | ORAL | Status: DC | PRN

## 2017-01-09 MED ORDER — ACETAMINOPHEN 650 MG RE SUPP: 650.0000 mg | Freq: Four times a day (QID) | RECTAL | Status: DC | PRN

## 2017-01-09 MED ORDER — IOPAMIDOL (ISOVUE-370) INJECTION 76%
INTRAVENOUS | Status: AC
  Administered 2017-01-09: 100 mL
  Filled 2017-01-09: qty 100

## 2017-01-09 MED ORDER — ACETAMINOPHEN 325 MG PO TABS
650.0000 mg | ORAL_TABLET | Freq: Once | ORAL | Status: AC
  Administered 2017-01-09: 650 mg via ORAL
  Filled 2017-01-09: qty 2

## 2017-01-09 MED ORDER — ALBUTEROL SULFATE (2.5 MG/3ML) 0.083% IN NEBU
5.0000 mg | INHALATION_SOLUTION | Freq: Once | RESPIRATORY_TRACT | Status: AC
  Administered 2017-01-09: 5 mg via RESPIRATORY_TRACT
  Filled 2017-01-09: qty 6

## 2017-01-09 MED ORDER — ACETAMINOPHEN 325 MG PO TABS
650.0000 mg | ORAL_TABLET | Freq: Four times a day (QID) | ORAL | Status: DC | PRN
  Administered 2017-01-09 – 2017-01-13 (×7): 650 mg via ORAL
  Filled 2017-01-09 (×7): qty 2

## 2017-01-09 MED ORDER — ENOXAPARIN SODIUM 40 MG/0.4ML ~~LOC~~ SOLN
40.0000 mg | SUBCUTANEOUS | Status: DC
  Administered 2017-01-09 – 2017-01-12 (×4): 40 mg via SUBCUTANEOUS
  Filled 2017-01-09 (×4): qty 0.4

## 2017-01-09 MED ORDER — NEBIVOLOL HCL 10 MG PO TABS
10.0000 mg | ORAL_TABLET | Freq: Every day | ORAL | Status: DC
  Administered 2017-01-10 – 2017-01-13 (×4): 10 mg via ORAL
  Filled 2017-01-09 (×3): qty 4
  Filled 2017-01-09 (×4): qty 1

## 2017-01-09 MED ORDER — HYDROCODONE-ACETAMINOPHEN 5-325 MG PO TABS: 1.0000 | ORAL_TABLET | ORAL | Status: DC | PRN

## 2017-01-09 MED ORDER — DOXYCYCLINE HYCLATE 100 MG PO TABS
100.0000 mg | ORAL_TABLET | Freq: Two times a day (BID) | ORAL | Status: DC
  Administered 2017-01-09 – 2017-01-13 (×9): 100 mg via ORAL
  Filled 2017-01-09 (×9): qty 1

## 2017-01-09 MED ORDER — PANTOPRAZOLE SODIUM 40 MG IV SOLR
40.0000 mg | INTRAVENOUS | Status: DC
  Administered 2017-01-09: 40 mg via INTRAVENOUS
  Filled 2017-01-09: qty 40

## 2017-01-09 MED ORDER — IPRATROPIUM-ALBUTEROL 0.5-2.5 (3) MG/3ML IN SOLN
3.0000 mL | RESPIRATORY_TRACT | Status: DC
  Administered 2017-01-09 (×2): 3 mL via RESPIRATORY_TRACT
  Filled 2017-01-09 (×2): qty 3

## 2017-01-09 MED ORDER — BISACODYL 10 MG RE SUPP: 10.0000 mg | Freq: Every day | RECTAL | Status: DC | PRN

## 2017-01-09 MED ORDER — POTASSIUM CHLORIDE CRYS ER 20 MEQ PO TBCR
30.0000 meq | EXTENDED_RELEASE_TABLET | Freq: Two times a day (BID) | ORAL | Status: DC
  Administered 2017-01-09 – 2017-01-11 (×6): 30 meq via ORAL
  Filled 2017-01-09 (×7): qty 1

## 2017-01-09 NOTE — H&P (Signed)
History and Physical    Kristina Watson:[id number] DOB: 1935/11/03 DOA: 01/09/2017   PCP: Jani Gravel, MD   Patient coming from:  Home    Chief Complaint: Shortness of breath   HPI: Kristina Watson is a 81 y.o. female with medical history significant for prior pulmonary embolism in 2005, COPD,  on O2  at 1-1.5 L prn at home, presenting via EMS with several our history of gradually worsening cough, wheezing, increasing shortness of breath. She denies any fever, hemoptysis, nausea or vomiting. She tried albuterol at home, without significant relief. She never had similar symptoms before, or had been hospitalized for COPD exacerbation. No fever.Denies any chest pain, chest wall pain or palpitations.Denies any sick contacts or recent long distant travels. Denies any abdominal pain. Denies lower extremity swelling or calf pain . No confusion was reported. Denies any vision changes or headaches.  No tobacco or recreational drug use.   ED Course:  BP 109/67 (BP Location: Left Arm)   Pulse (!) 109   Temp 98.4 F (36.9 C)   Resp (!) 24   Ht 5\' 9"  (1.753 m)   Wt 104.8 kg (231 lb)   SpO2 93%   BMI 34.11 kg/m    sodium 141 potassium 3.2 glucose 138 creatinine 0.7 BNP118.3 Tn 0.03 WBC 8.0 , Hb 14, PLt 284  DDmer 2.34  PT 12.8/INR 0.94 CT chest Negative for pulmonary embolism  Review of Systems:  As per HPI otherwise all other systems reviewed and are negative  Past Medical History:  Diagnosis Date  . Asthma with COPD (chronic obstructive pulmonary disease) ([place])   . Hyperlipidemia   . Hypertension   . Obesity (BMI 35.0-39.9 without comorbidity)   . Osteoarthritis of shoulder   . Pulmonary embolism ([place])   . Tachycardia     Past Surgical History:  Procedure Laterality Date  . LEFT HEART CATHETERIZATION WITH CORONARY ANGIOGRAM N/A 09/01/2011   Procedure: LEFT HEART CATHETERIZATION WITH CORONARY ANGIOGRAM;  Surgeon: Leonie Man, MD;  Location: [facility] CATH LAB;  Service:  Cardiovascular;  Laterality: N/A;    Social History Social History   Social History  . Marital status: Widowed    Spouse name: N/A  . Number of children: 3  . Years of education: N/A   Occupational History  . Not on file.   Social History Main Topics  . Smoking status: Never Smoker  . Smokeless tobacco: Not on file     Comment: Long term second hand smoke exposure  . Alcohol use Not on file  . Drug use: Unknown  . Sexual activity: Not on file   Other Topics Concern  . Not on file   Social History Narrative   Widow of 17 yrs.     Allergies  Allergen Reactions  . Warfarin Sodium Nausea And Vomiting    Bothers stomach    Family History  Problem Relation Age of Onset  . Cancer Mother        pancreatic  . Heart failure Brother        congenital heart disease  . Stroke Brother       Prior to Admission medications   Medication Sig Start Date End Date Taking? Authorizing Provider  acetaminophen (TYLENOL) 500 MG tablet Take 1,000 mg by mouth every 8 (eight) hours as needed. For pain   Yes [provider]  acetaminophen (TYLENOL) 500 MG tablet Take 500 mg by mouth every 6 (six) hours as needed for mild pain.  Yes [provider]  albuterol (PROVENTIL HFA;VENTOLIN HFA) 108 (90 Base) MCG/ACT inhaler Inhale 2 puffs into the lungs every 6 (six) hours as needed for wheezing or shortness of breath.   Yes [provider]  albuterol (PROVENTIL) (2.5 MG/3ML) 0.083% nebulizer solution Take 2.5 mg by nebulization 2 (two) times daily.   Yes [provider]  BYSTOLIC 10 MG tablet TAKE 1 TABLET BY MOUTH DAILY** NEED APPOINTMENT** 04/30/13  Yes Leonie Man, MD  cetirizine (ZYRTEC) 10 MG tablet Take 10 mg by mouth at bedtime.   Yes [provider]  tetrahydrozoline-zinc (VISINE-AC) 0.05-0.25 % ophthalmic solution Place 2 drops into both eyes 3 (three) times daily as needed. For dry eyes   Yes [provider]    Physical  Exam:  Vitals:   01/09/17 0830 01/09/17 0845 01/09/17 0924 01/09/17 1124  BP: (!) 130/58 127/65 135/66 109/67  Pulse: (!) 108 (!) 108 (!) 111 (!) 109  Resp: (!) 21 (!) 24 (!) 24 (!) 24  Temp:      SpO2: 94% 94% 93% 93%  Weight:      Height:       Constitutional: NAD, receiving Nebulizer treatments at the time of exam.  Eyes: PERRL, lids and conjunctivae normal ENMT: Mucous membranes are moist, without exudate or lesions  Neck: normal, supple, no masses, no thyromegaly Respiratory:  essentially clear to auscultation bilaterally, cannot hear wheezing,possible mild  Crackles at bases, may be masked by body habitus.on breathing treatments at the time of exam  Cardiovascular:  Mildly tachy rate and rhythm, no murmurs, rubs or gallops. No extremity edema. 2+ pedal pulses. No carotid bruits.  Abdomen:  Morbidly obese, Soft, non tender, No hepatosplenomegaly. Bowel sounds positive.  Musculoskeletal: no clubbing / cyanosis. Moves all extremities  Skin: no jaundice, No lesions. Tender at the pretibial area bilaterally in the setting of chronic neuropathy Neurologic: Sensation intact  Strength equal in all extremities Psychiatric:   Alert and oriented x 3.    Labs on Admission: I have personally reviewed following labs and imaging studies  CBC:  Recent Labs Lab 01/09/17 0415  WBC 8.0  NEUTROABS 3.9  HGB 14.3  HCT 43.3  MCV 92.5  PLT 850    Basic Metabolic Panel:  Recent Labs Lab 01/09/17 0415  NA 141  K 3.2*  CL 103  CO2 27  GLUCOSE 138*  BUN 17  CREATININE 0.70  CALCIUM 9.5    GFR: Estimated Creatinine Clearance: 74.7 mL/min (by C-G formula based on SCr of 0.7 mg/dL).  Liver Function Tests: No results for input(s): AST, ALT, ALKPHOS, BILITOT, PROT, ALBUMIN in the last 168 hours. No results for input(s): LIPASE, AMYLASE in the last 168 hours. No results for input(s): AMMONIA in the last 168 hours.  Coagulation Profile: No results for input(s): INR, PROTIME in the  last 168 hours.  Cardiac Enzymes:  Recent Labs Lab 01/09/17 0710  TROPONINI <0.03    BNP (last 3 results) No results for input(s): PROBNP in the last 8760 hours.  HbA1C: No results for input(s): HGBA1C in the last 72 hours.  CBG: No results for input(s): GLUCAP in the last 168 hours.  Lipid Profile: No results for input(s): CHOL, HDL, LDLCALC, TRIG, CHOLHDL, LDLDIRECT in the last 72 hours.  Thyroid Function Tests: No results for input(s): TSH, T4TOTAL, FREET4, T3FREE, THYROIDAB in the last 72 hours.  Anemia Panel: No results for input(s): VITAMINB12, FOLATE, FERRITIN, TIBC, IRON, RETICCTPCT in the last 72 hours.  Urine analysis:  Component Value Date/Time   COLORURINE YELLOW 09/22/2008 2046   APPEARANCEUR CLEAR 09/22/2008 2046   LABSPEC 1.028 09/22/2008 2046   PHURINE 5.5 09/22/2008 2046   GLUCOSEU NEGATIVE 09/22/2008 2046   HGBUR SMALL (A) 09/22/2008 2046   BILIRUBINUR NEGATIVE 09/22/2008 2046   KETONESUR NEGATIVE 09/22/2008 2046   PROTEINUR NEGATIVE 09/22/2008 2046   UROBILINOGEN 0.2 09/22/2008 2046   NITRITE NEGATIVE 09/22/2008 2046   LEUKOCYTESUR NEGATIVE 09/22/2008 2046    Sepsis Labs: @LABRCNTIP (procalcitonin:4,lacticidven:4) )No results found for this or any previous visit (from the past 240 hour(s)).   Radiological Exams on Admission: Dg Chest 2 View  Result Date: 01/09/2017 CLINICAL DATA:  Acute onset of shortness of breath. Initial encounter. EXAM: CHEST  2 VIEW COMPARISON:  Chest radiograph and CTA of the chest performed 08/11/2011 FINDINGS: The lungs are hypoexpanded. Left basilar airspace opacity may reflect atelectasis or pneumonia. Chronic peribronchial thickening is noted. There is no evidence of pleural effusion or pneumothorax. The heart is normal in size; the mediastinal contour is within normal limits. No acute osseous abnormalities are seen. IMPRESSION: Lungs hypoexpanded. Left basilar airspace opacity may reflect atelectasis or pneumonia.  Chronic peribronchial thickening noted. Electronically Signed   By: Garald Balding M.D.   On: 01/09/2017 05:05   Ct Angio Chest Pe W And/or Wo Contrast  Result Date: 01/09/2017 CLINICAL DATA:  Shortness of breath. EXAM: CT ANGIOGRAPHY CHEST WITH CONTRAST TECHNIQUE: Multidetector CT imaging of the chest was performed using the standard protocol during bolus administration of intravenous contrast. Multiplanar CT image reconstructions and MIPs were obtained to evaluate the vascular anatomy. CONTRAST:  100 cc Isovue 370 intravenous COMPARISON:  08/11/2011 FINDINGS: Cardiovascular: Satisfactory opacification of the pulmonary arteries to the segmental level. Intermittent artifact from respiratory motion; no evidence of pulmonary embolism. Normal heart size. No pericardial effusion. Tortuous aorta with atherosclerotic calcifications. Mediastinum/Nodes: Negative for adenopathy or mass. Lungs/Pleura: Low lung volumes with atelectasis greatest at the bases where the segmental bronchi are effaced. Patchy subpleural reticulation that is nonspecific in this setting. No fibrotic changes. No Kerley lines, air bronchogram, effusion or pneumothorax. Upper Abdomen: No acute finding. Partly seen the gallbladder is full and contains a gallstone. Musculoskeletal: Partly seen subdermal mass superior to the right shoulder is likely a attributed to dermal inclusion cyst given location. Spondylosis causing multilevel ankylosis. Review of the MIP images confirms the above findings. IMPRESSION: 1. Negative for pulmonary embolism. 2. Low volumes with atelectasis. 3. Cholelithiasis. 4. Aortic Atherosclerosis (ICD10-I70.0). Electronically Signed   By: Monte Fantasia M.D.   On: 01/09/2017 08:33    EKG: Independently reviewed.  Assessment/Plan Active Problems:   Asthma with COPD (chronic obstructive pulmonary disease) ([facility])   DOE (dyspnea on exertion)   Hypertension   Hyperlipidemia   Pulmonary embolism ([facility])   Obesity (BMI  35.0-39.9 without comorbidity)   Acute on chronic respiratory failure with hypoxia ([facility])   Acute on chronic hypoxic respiratory failure likely secondary to acute COPD exacerbation. Patient on 1.5 L O2 at home prn, now at 2 L continuous.  WBC 8.  DDmer 2.34. Afebrile. CT chest neg for PE or masses Received Nebs x 2 and Solumedrol 125 mg Admit to tele obs   Doxyxycline 100 mg bid  Duonebs and Albuterol  Prednisone 60 mg bid Protonix    Mucinex O2 prn CBC in am Incentive spirometry patient may benefit from pulmonary follow-up as outpatient.   Hypokalemia, may be dietary  EKG S tach , Tn neg  Current K 3.2   Oral  replenishment Repeat CMET in am  History of PE in 2005, not on oral anticoagulants. CT chest neg for PE  Can follow as OP with PCP    Hypertension BP 109/67   Pulse 109    Not on meds at home  Add Hydralazine Q6 hours as needed for BP 160/90    DVT prophylaxis: Lovenox  Code Status:   Full    Family Communication:  Discussed with patient Disposition Plan: Expect patient to be discharged to home after condition improves Consults called: none     Admission status:Tele   Obs     Victorine Mcnee E, PA-C [facility]   01/09/2017, 11:27 AM

## 2017-01-09 NOTE — ED Triage Notes (Signed)
Pt from home via [facility]. Pt called out c/o respiratory distress. Hx COPD & chronic bronchitis. Given 1mg  Atrovent, 125mg  Solumedrol & 15mg  Albuterol PTA by [facility].

## 2017-01-09 NOTE — ED Notes (Signed)
Patient transported to CT 

## 2017-01-09 NOTE — ED Provider Notes (Signed)
  Physical Exam  BP (!) 120/50   Pulse (!) 109   Temp 98.4 F (36.9 C)   Resp (!) 27   Ht 5\' 9"  (1.753 m)   Wt 104.8 kg (231 lb)   SpO2 92%   BMI 34.11 kg/m   Physical Exam  ED Course  Procedures  MDM  Assuming care of patient from Dr. Christy Gentles   Patient in the ED for DIB. Has hx of COPD. Pt had no wheezing at arrival, EMS had given nebs prior to ER arrival. Pt woke up with sudden onset worsening of symptoms. Workup thus far shows no acute changes.  Concerning findings are as following initial presentation of dib with diophoresis and no clear wheezing. Important pending results are CT PE  According to Dr. Christy Gentles, plan is to admit to the hospital for COPD exacerbation if PE is neg.   Patient had no complains, no concerns from the nursing side. Will continue to monitor.       Varney Biles, MD 01/09/17 [phone]

## 2017-01-09 NOTE — ED Notes (Signed)
Pt called out reporting dizziness. VS obtained, unchanged from prior. Pt a x 4. No change in condition. ED MD made aware , who will round on patient. Family at bedside.

## 2017-01-09 NOTE — ED Provider Notes (Signed)
[facility] DEPT Provider Note   CSN: [id number] Arrival date & time: 01/09/17  0416     History   Chief Complaint Chief Complaint  Patient presents with  . Respiratory Distress    HPI Kristina Watson is a 81 y.o. female.  The history is provided by the patient and a relative.  Shortness of Breath  This is a new problem. Duration: several hours ago. The problem has been gradually worsening. Associated symptoms include cough, sputum production and wheezing. Pertinent negatives include no fever, no hemoptysis and no vomiting. Treatments tried: albuterol. The treatment provided moderate relief. Associated medical issues include COPD.  Patient with h/o COPD, on home oxygen at night (1.5L) presents with acute shortness of breath that started while asleep She has chronic shortness of breath but this was much more severe. No fever No active CP She was given albuterol nebs en route with some improvement  Past Medical History:  Diagnosis Date  . Asthma with COPD (chronic obstructive pulmonary disease)   . Hyperlipidemia   . Hypertension   . Obesity (BMI 35.0-39.9 without comorbidity)   . Osteoarthritis of shoulder   . Pulmonary embolism   . Tachycardia     Patient Active Problem List   Diagnosis Date Noted  . DOE (dyspnea on exertion) 09/01/2011    Class: Hospitalized for  . Hypertension   . Hyperlipidemia     Class: Diagnosis of  . Pulmonary embolism (HCC)     Class: History of  . Obesity (BMI 35.0-39.9 without comorbidity)     Class: Chronic  . Asthma with COPD (chronic obstructive pulmonary disease) ([facility])     Class: Diagnosis of  . Abnormal nuclear stress test 08/30/2011    Class: Hospitalized for    Past Surgical History:  Procedure Laterality Date  . LEFT HEART CATHETERIZATION WITH CORONARY ANGIOGRAM N/A 09/01/2011   Procedure: LEFT HEART CATHETERIZATION WITH CORONARY ANGIOGRAM;  Surgeon: Leonie Man, MD;  Location: [facility] CATH LAB;  Service: Cardiovascular;   Laterality: N/A;    OB History    No data available       Home Medications    Prior to Admission medications   Medication Sig Start Date End Date Taking? Authorizing Provider  acetaminophen (TYLENOL) 500 MG tablet Take 1,000 mg by mouth every 8 (eight) hours as needed. For pain    [provider]  albuterol (PROVENTIL) (2.5 MG/3ML) 0.083% nebulizer solution Take 2.5 mg by nebulization 2 (two) times daily.    [provider]  aspirin EC 81 MG tablet Take 81 mg by mouth at bedtime.    [provider]  BYSTOLIC 10 MG tablet TAKE 1 TABLET BY MOUTH DAILY** NEED APPOINTMENT** 04/30/13   Leonie Man, MD  cetirizine (ZYRTEC) 10 MG tablet Take 10 mg by mouth at bedtime.    [provider]  tetrahydrozoline-zinc (VISINE-AC) 0.05-0.25 % ophthalmic solution Place 2 drops into both eyes 3 (three) times daily as needed. For dry eyes    [provider]    Family History Family History  Problem Relation Age of Onset  . Cancer Mother        pancreatic  . Heart failure Brother        congenital heart disease  . Stroke Brother     Social History Social History  Substance Use Topics  . Smoking status: Never Smoker  . Smokeless tobacco: Not on file     Comment: Long term second hand smoke exposure  . Alcohol use  Not on file     Allergies   Warfarin sodium   Review of Systems Review of Systems  Constitutional: Negative for fever.  Respiratory: Positive for cough, sputum production, shortness of breath and wheezing. Negative for hemoptysis.   Gastrointestinal: Negative for vomiting.  All other systems reviewed and are negative.    Physical Exam Updated Vital Signs BP (!) 120/50   Pulse (!) 109   Temp 98.4 F (36.9 C)   Resp (!) 27   Ht 1.753 m (5\' 9" )   Wt 104.8 kg (231 lb)   SpO2 91%   BMI 34.11 kg/m   Physical Exam CONSTITUTIONAL: elderly, chronically ill appearing HEAD: Normocephalic/atraumatic EYES: EOMI  ENMT:  Mucous membranes moist NECK: supple no meningeal signs SPINE/BACK:entire spine nontender CV:  tachycardic, irregular LUNGS: decreased BS noted in the bases.  No significant wheezing.  Tachypnea noted ABDOMEN: soft, nontender, obese GU:no cva tenderness NEURO: Pt is awake/alert/appropriate, moves all extremitiesx4.   EXTREMITIES: pulses normal/equal, full ROM SKIN: warm, color normal PSYCH: no abnormalities of mood noted, alert and oriented to situation   ED Treatments / Results  Labs (all labs ordered are listed, but only abnormal results are displayed) Labs Reviewed  BASIC METABOLIC PANEL - Abnormal; Notable for the following:       Result Value   Potassium 3.2 (*)    Glucose, Bld 138 (*)    All other components within normal limits  BRAIN NATRIURETIC PEPTIDE - Abnormal; Notable for the following:    B Natriuretic Peptide 118.3 (*)    All other components within normal limits  D-DIMER, QUANTITATIVE (NOT AT [facility]) - Abnormal; Notable for the following:    D-Dimer, Quant 2.34 (*)    All other components within normal limits  CBC WITH DIFFERENTIAL/PLATELET    EKG  EKG Interpretation  Date/Time:  Monday January 09 2017 04:24:04 EDT Ventricular Rate:  113 PR Interval:    QRS Duration: 102 QT Interval:  339 QTC Calculation: 465 R Axis:   -44 Text Interpretation:  Sinus tachycardia Multiform ventricular premature complexes Left anterior fascicular block Abnormal R-wave progression, early transition LVH with secondary repolarization abnormality Confirmed by Ripley Fraise [phone]) on 01/09/2017 4:31:53 AM Also confirmed by Ripley Fraise [phone]), editor Hattie Perch ([id number])  on 01/09/2017 6:40:16 AM       Radiology Dg Chest 2 View  Result Date: 01/09/2017 CLINICAL DATA:  Acute onset of shortness of breath. Initial encounter. EXAM: CHEST  2 VIEW COMPARISON:  Chest radiograph and CTA of the chest performed 08/11/2011 FINDINGS: The lungs are hypoexpanded. Left basilar  airspace opacity may reflect atelectasis or pneumonia. Chronic peribronchial thickening is noted. There is no evidence of pleural effusion or pneumothorax. The heart is normal in size; the mediastinal contour is within normal limits. No acute osseous abnormalities are seen. IMPRESSION: Lungs hypoexpanded. Left basilar airspace opacity may reflect atelectasis or pneumonia. Chronic peribronchial thickening noted. Electronically Signed   By: Garald Balding M.D.   On: 01/09/2017 05:05    Procedures Procedures   Medications Ordered in ED Medications  sodium chloride 0.9 % bolus 500 mL (not administered)  nitroGLYCERIN (NITROGLYN) 2 % ointment 1 inch (1 inch Topical Given 01/09/17 0532)  acetaminophen (TYLENOL) tablet 650 mg (650 mg Oral Given 01/09/17 0601)  albuterol (PROVENTIL) (2.5 MG/3ML) 0.083% nebulizer solution 5 mg (5 mg Nebulization Given 01/09/17 3536)     Initial Impression / Assessment and Plan / ED Course  I have reviewed the triage vital signs and the nursing  notes.  Pertinent labs & imaging results that were available during my care of the patient were reviewed by me and considered in my medical decision making (see chart for details).     7:05 AM Pt is a difficult historian, but it appears she had acute worsening SOB tonight, more than baseline She was given albuterol/atrovent/solumedrol by EMS No active CP reported Due to body habitus, initial exam difficult, no significant wheeze but decreased BS.  She was hypertensive and she was given NTG, but had immediate hypotension This resolved with removal of NTG paste  After further discussion, pt has h/o PE back in 2005 Due to persistent dyspnea with h/o PE and elevated d-dimer willl proceed with CT chest  7:07 AM At signout to dr Kathrynn Humble, f/u on CT chest and then admit patient   Final Clinical Impressions(s) / ED Diagnoses   Final diagnoses:  COPD exacerbation [facility])    New Prescriptions New Prescriptions   No medications  on file     Ripley Fraise, MD 01/09/17 [phone]

## 2017-01-10 ENCOUNTER — Observation Stay (HOSPITAL_BASED_OUTPATIENT_CLINIC_OR_DEPARTMENT_OTHER): Payer: Medicare Other

## 2017-01-10 DIAGNOSIS — J441 Chronic obstructive pulmonary disease with (acute) exacerbation: Secondary | ICD-10-CM | POA: Diagnosis not present

## 2017-01-10 DIAGNOSIS — J449 Chronic obstructive pulmonary disease, unspecified: Secondary | ICD-10-CM

## 2017-01-10 DIAGNOSIS — I472 Ventricular tachycardia: Secondary | ICD-10-CM | POA: Diagnosis not present

## 2017-01-10 DIAGNOSIS — I10 Essential (primary) hypertension: Secondary | ICD-10-CM | POA: Diagnosis not present

## 2017-01-10 DIAGNOSIS — J9621 Acute and chronic respiratory failure with hypoxia: Secondary | ICD-10-CM | POA: Diagnosis not present

## 2017-01-10 LAB — COMPREHENSIVE METABOLIC PANEL
ALBUMIN: 3.4 g/dL — AB (ref 3.5–5.0)
ALK PHOS: 62 U/L (ref 38–126)
ALT: 17 U/L (ref 14–54)
AST: 24 U/L (ref 15–41)
Anion gap: 7 (ref 5–15)
BUN: 19 mg/dL (ref 6–20)
CALCIUM: 9.8 mg/dL (ref 8.9–10.3)
CHLORIDE: 105 mmol/L (ref 101–111)
CO2: 29 mmol/L (ref 22–32)
CREATININE: 0.84 mg/dL (ref 0.44–1.00)
GFR calc non Af Amer: >60 mL/min (ref >60)
GLUCOSE: 152 mg/dL — AB (ref 65–99)
Potassium: 4.5 mmol/L (ref 3.5–5.1)
SODIUM: 141 mmol/L (ref 135–145)
Total Bilirubin: 0.8 mg/dL (ref 0.3–1.2)
Total Protein: 7.2 g/dL (ref 6.5–8.1)

## 2017-01-10 LAB — CBC
HCT: 40.3 % (ref 36.0–46.0)
Hemoglobin: 13 g/dL (ref 12.0–15.0)
MCH: 29.8 pg (ref 26.0–34.0)
MCHC: 32.3 g/dL (ref 30.0–36.0)
MCV: 92.4 fL (ref 78.0–100.0)
PLATELETS: 273 10*3/uL (ref 150–400)
RBC: 4.36 MIL/uL (ref 3.87–5.11)
RDW: 13.7 % (ref 11.5–15.5)
WBC: 12.9 10*3/uL — ABNORMAL HIGH (ref 4.0–10.5)

## 2017-01-10 LAB — MAGNESIUM: MAGNESIUM: 2.1 mg/dL (ref 1.7–2.4)

## 2017-01-10 LAB — ECHOCARDIOGRAM COMPLETE
Height: 69 in
Weight: 3629.65 oz

## 2017-01-10 LAB — GLUCOSE, CAPILLARY: Glucose-Capillary: 171 mg/dL — ABNORMAL HIGH (ref 65–99)

## 2017-01-10 MED ORDER — ALBUTEROL SULFATE (2.5 MG/3ML) 0.083% IN NEBU: 2.5000 mg | INHALATION_SOLUTION | RESPIRATORY_TRACT | Status: DC | PRN

## 2017-01-10 MED ORDER — ALBUTEROL SULFATE (2.5 MG/3ML) 0.083% IN NEBU: 2.5000 mg | INHALATION_SOLUTION | Freq: Four times a day (QID) | RESPIRATORY_TRACT | Status: DC | PRN

## 2017-01-10 MED ORDER — PANTOPRAZOLE SODIUM 40 MG PO TBEC
40.0000 mg | DELAYED_RELEASE_TABLET | Freq: Every day | ORAL | Status: DC
  Administered 2017-01-10 – 2017-01-13 (×4): 40 mg via ORAL
  Filled 2017-01-10 (×4): qty 1

## 2017-01-10 MED ORDER — PHENOL 1.4 % MT LIQD
1.0000 | OROMUCOSAL | Status: DC | PRN
  Administered 2017-01-10: 1 via OROMUCOSAL
  Filled 2017-01-10: qty 177

## 2017-01-10 MED ORDER — GUAIFENESIN ER 600 MG PO TB12
600.0000 mg | ORAL_TABLET | Freq: Two times a day (BID) | ORAL | Status: DC
  Administered 2017-01-10 – 2017-01-13 (×7): 600 mg via ORAL
  Filled 2017-01-10 (×7): qty 1

## 2017-01-10 NOTE — Progress Notes (Signed)
PROGRESS NOTE    Kristina Watson  HOZ:[id number] DOB: 1935/12/25 DOA: 01/09/2017 PCP: Jani Gravel, MD     Brief Narrative:  81 y.o. female who presents with acute on chronic respiratory failure secondary to COPD exacerbation. Assessment & Plan:   1-Acute COPD exacerbation : Hx of second hand smoking for long period of time with her husband ,Mild  improving with COPD acute management , continue Nubs/steriods/doxy for one more day before switching to oral steroids. 2-Hx of PE: CTPE protocol is negative , unclear why she is not taking [place] . 3-5 RUNS of Vtach in the am , resolved, increase BB , decrease the Nubs , send for 2D echo .    DVT prophylaxis: (Lovenox) Code Status: (Full) Family Communication: (none Disposition Plan: (home   Consultants:   None     Procedures: none   Antimicrobials: (specify start and planned stop date. Auto populated tables are space occupying and do not give end dates)  Doxy day 2   Subjective:   Denies any complaints , SOB is improving partially she states , no CP no Fevers or chills .  Objective: Vitals:   01/09/17 1739 01/09/17 1839 01/09/17 2144 01/10/17 0501  BP: (!) 161/80 140/85 126/82 135/61  Pulse: (!) 102 (!) 111 (!) 102 85  Resp: (!) 21 18 18 18   Temp:  99.7 F (37.6 C) 98.6 F (37 C) 98.4 F (36.9 C)  TempSrc:  Oral Oral Oral  SpO2: 91% 95% 92% 94%  Weight:  107.5 kg (236 lb 15.9 oz)  102.9 kg (226 lb 13.7 oz)  Height:  5\' 9"  (1.753 m)      Intake/Output Summary (Last 24 hours) at 01/10/17 1116 Last data filed at 01/10/17 1000  Gross per 24 hour  Intake              560 ml  Output              800 ml  Net             -240 ml   Filed Weights   01/09/17 0426 01/09/17 1839 01/10/17 0501  Weight: 104.8 kg (231 lb) 107.5 kg (236 lb 15.9 oz) 102.9 kg (226 lb 13.7 oz)    Examination:  General exam: Appears calm and comfortable  Respiratory system: scattered mild exp wheezing  Cardiovascular system: S1 & S2 heard,  RRR. No JVD, murmurs, rubs, gallops or clicks. No pedal edema. Gastrointestinal system: Abdomen is nondistended, soft and nontender. No organomegaly or masses felt. Normal bowel sounds heard. Central nervous system: Alert and oriented. No focal neurological deficits. Extremities: Symmetric 5 x 5 power. Skin: No rashes, lesions or ulcers Psychiatry: Judgement and insight appear normal. Mood & affect appropriate.     Data Reviewed: I have personally reviewed following labs and imaging studies  CBC:  Recent Labs Lab 01/09/17 0415 01/10/17 0343  WBC 8.0 12.9*  NEUTROABS 3.9  --   HGB 14.3 13.0  HCT 43.3 40.3  MCV 92.5 92.4  PLT 284 704   Basic Metabolic Panel:  Recent Labs Lab 01/09/17 0415 01/10/17 0343 01/10/17 0642  NA 141 141  --   K 3.2* 4.5  --   CL 103 105  --   CO2 27 29  --   GLUCOSE 138* 152*  --   BUN 17 19  --   CREATININE 0.70 0.84  --   CALCIUM 9.5 9.8  --   MG  --   --  2.1  GFR: Estimated Creatinine Clearance: 70.5 mL/min (by C-G formula based on SCr of 0.84 mg/dL). Liver Function Tests:  Recent Labs Lab 01/10/17 0343  AST 24  ALT 17  ALKPHOS 62  BILITOT 0.8  PROT 7.2  ALBUMIN 3.4*   No results for input(s): LIPASE, AMYLASE in the last 168 hours. No results for input(s): AMMONIA in the last 168 hours. Coagulation Profile: No results for input(s): INR, PROTIME in the last 168 hours. Cardiac Enzymes:  Recent Labs Lab 01/09/17 0710  TROPONINI <0.03   BNP (last 3 results) No results for input(s): PROBNP in the last 8760 hours. HbA1C: No results for input(s): HGBA1C in the last 72 hours. CBG: No results for input(s): GLUCAP in the last 168 hours. Lipid Profile: No results for input(s): CHOL, HDL, LDLCALC, TRIG, CHOLHDL, LDLDIRECT in the last 72 hours. Thyroid Function Tests: No results for input(s): TSH, T4TOTAL, FREET4, T3FREE, THYROIDAB in the last 72 hours. Anemia Panel: No results for input(s): VITAMINB12, FOLATE, FERRITIN,  TIBC, IRON, RETICCTPCT in the last 72 hours. Urine analysis:    Component Value Date/Time   COLORURINE YELLOW 09/22/2008 2046   APPEARANCEUR CLEAR 09/22/2008 2046   LABSPEC 1.028 09/22/2008 2046   PHURINE 5.5 09/22/2008 2046   GLUCOSEU NEGATIVE 09/22/2008 2046   HGBUR SMALL (A) 09/22/2008 2046   BILIRUBINUR NEGATIVE 09/22/2008 2046   KETONESUR NEGATIVE 09/22/2008 2046   PROTEINUR NEGATIVE 09/22/2008 2046   UROBILINOGEN 0.2 09/22/2008 2046   NITRITE NEGATIVE 09/22/2008 2046   LEUKOCYTESUR NEGATIVE 09/22/2008 2046   Sepsis Labs: @LABRCNTIP (procalcitonin:4,lacticidven:4)  )No results found for this or any previous visit (from the past 240 hour(s)).       Radiology Studies: Dg Chest 2 View  Result Date: 01/09/2017 CLINICAL DATA:  Acute onset of shortness of breath. Initial encounter. EXAM: CHEST  2 VIEW COMPARISON:  Chest radiograph and CTA of the chest performed 08/11/2011 FINDINGS: The lungs are hypoexpanded. Left basilar airspace opacity may reflect atelectasis or pneumonia. Chronic peribronchial thickening is noted. There is no evidence of pleural effusion or pneumothorax. The heart is normal in size; the mediastinal contour is within normal limits. No acute osseous abnormalities are seen. IMPRESSION: Lungs hypoexpanded. Left basilar airspace opacity may reflect atelectasis or pneumonia. Chronic peribronchial thickening noted. Electronically Signed   By: Garald Balding M.D.   On: 01/09/2017 05:05   Ct Angio Chest Pe W And/or Wo Contrast  Result Date: 01/09/2017 CLINICAL DATA:  Shortness of breath. EXAM: CT ANGIOGRAPHY CHEST WITH CONTRAST TECHNIQUE: Multidetector CT imaging of the chest was performed using the standard protocol during bolus administration of intravenous contrast. Multiplanar CT image reconstructions and MIPs were obtained to evaluate the vascular anatomy. CONTRAST:  100 cc Isovue 370 intravenous COMPARISON:  08/11/2011 FINDINGS: Cardiovascular: Satisfactory  opacification of the pulmonary arteries to the segmental level. Intermittent artifact from respiratory motion; no evidence of pulmonary embolism. Normal heart size. No pericardial effusion. Tortuous aorta with atherosclerotic calcifications. Mediastinum/Nodes: Negative for adenopathy or mass. Lungs/Pleura: Low lung volumes with atelectasis greatest at the bases where the segmental bronchi are effaced. Patchy subpleural reticulation that is nonspecific in this setting. No fibrotic changes. No Kerley lines, air bronchogram, effusion or pneumothorax. Upper Abdomen: No acute finding. Partly seen the gallbladder is full and contains a gallstone. Musculoskeletal: Partly seen subdermal mass superior to the right shoulder is likely a attributed to dermal inclusion cyst given location. Spondylosis causing multilevel ankylosis. Review of the MIP images confirms the above findings. IMPRESSION: 1. Negative for pulmonary embolism. 2. Low  volumes with atelectasis. 3. Cholelithiasis. 4. Aortic Atherosclerosis (ICD10-I70.0). Electronically Signed   By: Monte Fantasia M.D.   On: 01/09/2017 08:33        Scheduled Meds: . dextromethorphan-guaiFENesin  1 tablet Oral BID  . doxycycline  100 mg Oral Q12H  . enoxaparin (LOVENOX) injection  40 mg Subcutaneous Q24H  . ipratropium-albuterol  3 mL Nebulization TID  . methylPREDNISolone (SOLU-MEDROL) injection  60 mg Intravenous Q12H  . nebivolol  10 mg Oral Daily  . pantoprazole  40 mg Oral Daily  . potassium chloride  30 mEq Oral BID   Continuous Infusions:   LOS: 0 days    Time spent: 35 minutes     Waldron Session, MD [facility]  If 7PM-7AM, please contact night-coverage [email or web address] Password [email or web address] 01/10/2017, 11:16 AM

## 2017-01-10 NOTE — Progress Notes (Signed)
Pt had 5beats of V-TACH on call practitioner Schorr has been informed, did not give any new orders will continue to monitor pt

## 2017-01-10 NOTE — Progress Notes (Signed)
  Echocardiogram 2D Echocardiogram has been performed.  Darlina Sicilian M 01/10/2017, 3:05 PM

## 2017-01-10 NOTE — Care Management Obs Status (Signed)
[place] NOTIFICATION   Patient Details  Name: Kristina Watson MRN: [id number] Date of Birth: 08-31-1935   Medicare Observation Status Notification Given:  Yes    Sharin Mons, RN 01/10/2017, 4:10 PM

## 2017-01-11 DIAGNOSIS — Z823 Family history of stroke: Secondary | ICD-10-CM | POA: Diagnosis not present

## 2017-01-11 DIAGNOSIS — Z9981 Dependence on supplemental oxygen: Secondary | ICD-10-CM | POA: Diagnosis not present

## 2017-01-11 DIAGNOSIS — J441 Chronic obstructive pulmonary disease with (acute) exacerbation: Secondary | ICD-10-CM | POA: Diagnosis not present

## 2017-01-11 DIAGNOSIS — E785 Hyperlipidemia, unspecified: Secondary | ICD-10-CM | POA: Diagnosis present

## 2017-01-11 DIAGNOSIS — E669 Obesity, unspecified: Secondary | ICD-10-CM | POA: Diagnosis present

## 2017-01-11 DIAGNOSIS — Z6834 Body mass index (BMI) 34.0-34.9, adult: Secondary | ICD-10-CM | POA: Diagnosis not present

## 2017-01-11 DIAGNOSIS — I472 Ventricular tachycardia: Secondary | ICD-10-CM

## 2017-01-11 DIAGNOSIS — Z8249 Family history of ischemic heart disease and other diseases of the circulatory system: Secondary | ICD-10-CM | POA: Diagnosis not present

## 2017-01-11 DIAGNOSIS — Z86711 Personal history of pulmonary embolism: Secondary | ICD-10-CM | POA: Diagnosis not present

## 2017-01-11 DIAGNOSIS — I10 Essential (primary) hypertension: Secondary | ICD-10-CM | POA: Diagnosis present

## 2017-01-11 DIAGNOSIS — E876 Hypokalemia: Secondary | ICD-10-CM | POA: Diagnosis present

## 2017-01-11 DIAGNOSIS — R627 Adult failure to thrive: Secondary | ICD-10-CM | POA: Diagnosis present

## 2017-01-11 DIAGNOSIS — J9621 Acute and chronic respiratory failure with hypoxia: Secondary | ICD-10-CM | POA: Diagnosis not present

## 2017-01-11 DIAGNOSIS — Z800 Family history of malignant neoplasm of digestive organs: Secondary | ICD-10-CM | POA: Diagnosis not present

## 2017-01-11 LAB — MRSA PCR SCREENING: MRSA BY PCR: NEGATIVE

## 2017-01-11 LAB — GLUCOSE, CAPILLARY
Glucose-Capillary: 139 mg/dL — ABNORMAL HIGH (ref 65–99)
Glucose-Capillary: 159 mg/dL — ABNORMAL HIGH (ref 65–99)

## 2017-01-11 LAB — RESPIRATORY PANEL BY PCR
Adenovirus: NOT DETECTED
BORDETELLA PERTUSSIS-RVPCR: NOT DETECTED
CORONAVIRUS 229E-RVPPCR: NOT DETECTED
CORONAVIRUS HKU1-RVPPCR: NOT DETECTED
Chlamydophila pneumoniae: NOT DETECTED
Coronavirus NL63: NOT DETECTED
Coronavirus OC43: NOT DETECTED
INFLUENZA B-RVPPCR: NOT DETECTED
Influenza A: NOT DETECTED
MYCOPLASMA PNEUMONIAE-RVPPCR: NOT DETECTED
Metapneumovirus: NOT DETECTED
PARAINFLUENZA VIRUS 2-RVPPCR: NOT DETECTED
Parainfluenza Virus 1: NOT DETECTED
Parainfluenza Virus 3: NOT DETECTED
Parainfluenza Virus 4: NOT DETECTED
Respiratory Syncytial Virus: NOT DETECTED
Rhinovirus / Enterovirus: NOT DETECTED

## 2017-01-11 LAB — EXPECTORATED SPUTUM ASSESSMENT W REFEX TO RESP CULTURE

## 2017-01-11 LAB — EXPECTORATED SPUTUM ASSESSMENT W GRAM STAIN, RFLX TO RESP C

## 2017-01-11 MED ORDER — FLUTICASONE PROPIONATE 50 MCG/ACT NA SUSP
1.0000 | Freq: Every day | NASAL | Status: DC
  Administered 2017-01-11 – 2017-01-13 (×3): 1 via NASAL
  Filled 2017-01-11: qty 16

## 2017-01-11 MED ORDER — LORATADINE 10 MG PO TABS
10.0000 mg | ORAL_TABLET | Freq: Every day | ORAL | Status: DC
  Administered 2017-01-11 – 2017-01-13 (×3): 10 mg via ORAL
  Filled 2017-01-11 (×3): qty 1

## 2017-01-11 NOTE — Progress Notes (Signed)
PROGRESS NOTE  Kristina Watson CHE:[id number] DOB: 1935-11-06 DOA: 01/09/2017 PCP: Jani Gravel, MD  HPI/Recap of past 24 hours: Some dry cough, feeling congested, wheezing improved, no fever, no edema, son at bedside She report hot weather cause exacerbation  Report never smoker but with second hand smoking history Son at bedside   Assessment/Plan: Active Problems:   DOE (dyspnea on exertion)   Hypertension   Hyperlipidemia   Pulmonary embolism (HCC)   Obesity (BMI 35.0-39.9 without comorbidity)   Asthma with COPD (chronic obstructive pulmonary disease) ([place])   Acute on chronic respiratory failure with hypoxia (HCC)   -Acute COPD exacerbation  vs asthma exacerbation : Hx of second hand smoking for long period of time with her husband ,Mild  remain wheezing, but seems improving , continue Nubs/steriods/doxy for one more day before switching to oral steroids.  Hx of PE: CTPE protocol is negative , unclear why she is not taking [place] .  5 RUNS of Vtach on 8/7 at 5:23 am , I have personally reviewed tele strip ,  Resolved on increase BB , decrease the Nubs , 2D echo with grade I diastolic dysfunction Keep mag>2, k.4 .  FTT; will get PT eval  Body mass index is 34.15 kg/m.  Code Status: full  Family Communication: patient and son at bedside  Disposition Plan: pending PT eval, likely home with home health   Consultants:  none  Procedures:  none  Antibiotics:  doxycycline   Objective: BP (!) 130/50 (BP Location: Right Arm)   Pulse 81   Temp 98.4 F (36.9 C) (Oral)   Resp 17   Ht 5\' 9"  (1.753 m)   Wt 104.9 kg (231 lb 4.2 oz)   SpO2 95%   BMI 34.15 kg/m   Intake/Output Summary (Last 24 hours) at 01/11/17 0841 Last data filed at 01/11/17 5361  Gross per 24 hour  Intake              240 ml  Output             2300 ml  Net            -2060 ml   Filed Weights   01/09/17 1839 01/10/17 0501 01/11/17 0455  Weight: 107.5 kg (236 lb 15.9 oz) 102.9 kg (226 lb  13.7 oz) 104.9 kg (231 lb 4.2 oz)    Exam: Patient is examined daily including today on 01/11/2017, exam remain the same as of yesterday except that is highlighted below   General:  NAD, some intermittent cough  Cardiovascular: RRR  Respiratory: mild scattered wheezing  Abdomen: Soft/ND/NT, positive BS  Musculoskeletal: No Edema  Neuro: aaox3  Data Reviewed: Basic Metabolic Panel:  Recent Labs Lab 01/09/17 0415 01/10/17 0343 01/10/17 0642  NA 141 141  --   K 3.2* 4.5  --   CL 103 105  --   CO2 27 29  --   GLUCOSE 138* 152*  --   BUN 17 19  --   CREATININE 0.70 0.84  --   CALCIUM 9.5 9.8  --   MG  --   --  2.1   Liver Function Tests:  Recent Labs Lab 01/10/17 0343  AST 24  ALT 17  ALKPHOS 62  BILITOT 0.8  PROT 7.2  ALBUMIN 3.4*   No results for input(s): LIPASE, AMYLASE in the last 168 hours. No results for input(s): AMMONIA in the last 168 hours. CBC:  Recent Labs Lab 01/09/17 0415 01/10/17 0343  WBC  8.0 12.9*  NEUTROABS 3.9  --   HGB 14.3 13.0  HCT 43.3 40.3  MCV 92.5 92.4  PLT 284 273   Cardiac Enzymes:    Recent Labs Lab 01/09/17 0710  TROPONINI <0.03   BNP (last 3 results)  Recent Labs  01/09/17 0415  BNP 118.3*    ProBNP (last 3 results) No results for input(s): PROBNP in the last 8760 hours.  CBG:  Recent Labs Lab 01/10/17 2039 01/11/17 0822  GLUCAP 171* 139*    No results found for this or any previous visit (from the past 240 hour(s)).   Studies: No results found.  Scheduled Meds: . doxycycline  100 mg Oral Q12H  . enoxaparin (LOVENOX) injection  40 mg Subcutaneous Q24H  . guaiFENesin  600 mg Oral BID  . ipratropium-albuterol  3 mL Nebulization TID  . methylPREDNISolone (SOLU-MEDROL) injection  60 mg Intravenous Q12H  . nebivolol  10 mg Oral Daily  . pantoprazole  40 mg Oral Daily  . potassium chloride  30 mEq Oral BID    Continuous Infusions:   Time spent: 67mins I have personally reviewed and  interpreted daily labs, tele strips, imagings as discussed above under date review session and assessment and plans.  Vasil Juhasz MD, PhD  [facility] Pager [phone]. If 7PM-7AM, please contact night-coverage at [email or web address], password [facility] 01/11/2017, 8:41 AM  LOS: 0 days

## 2017-01-11 NOTE — Progress Notes (Signed)
CM received consult for home health needs. PT evaluation pending .Marland KitchenMarland KitchenCM to follow for disposition needs. Whitman Hero RN,BSN,CM

## 2017-01-12 DIAGNOSIS — E669 Obesity, unspecified: Secondary | ICD-10-CM

## 2017-01-12 LAB — CBC
HEMATOCRIT: 42.9 % (ref 36.0–46.0)
Hemoglobin: 13.5 g/dL (ref 12.0–15.0)
MCH: 29.5 pg (ref 26.0–34.0)
MCHC: 31.5 g/dL (ref 30.0–36.0)
MCV: 93.9 fL (ref 78.0–100.0)
Platelets: 220 10*3/uL (ref 150–400)
RBC: 4.57 MIL/uL (ref 3.87–5.11)
RDW: 13.7 % (ref 11.5–15.5)
WBC: 11.2 10*3/uL — ABNORMAL HIGH (ref 4.0–10.5)

## 2017-01-12 LAB — MAGNESIUM: Magnesium: 2.1 mg/dL (ref 1.7–2.4)

## 2017-01-12 LAB — BASIC METABOLIC PANEL
ANION GAP: 11 (ref 5–15)
BUN: 29 mg/dL — ABNORMAL HIGH (ref 6–20)
CALCIUM: 9.9 mg/dL (ref 8.9–10.3)
CO2: 26 mmol/L (ref 22–32)
Chloride: 102 mmol/L (ref 101–111)
Creatinine, Ser: 0.76 mg/dL (ref 0.44–1.00)
Glucose, Bld: 127 mg/dL — ABNORMAL HIGH (ref 65–99)
POTASSIUM: 5.6 mmol/L — AB (ref 3.5–5.1)
Sodium: 139 mmol/L (ref 135–145)

## 2017-01-12 LAB — GLUCOSE, CAPILLARY
GLUCOSE-CAPILLARY: 139 mg/dL — AB (ref 65–99)
GLUCOSE-CAPILLARY: 193 mg/dL — AB (ref 65–99)

## 2017-01-12 MED ORDER — PREDNISONE 50 MG PO TABS
60.0000 mg | ORAL_TABLET | Freq: Every day | ORAL | Status: DC
  Administered 2017-01-13: 60 mg via ORAL
  Filled 2017-01-12: qty 1

## 2017-01-12 NOTE — Evaluation (Signed)
Physical Therapy Evaluation Patient Details Name: Kristina Watson MRN: [id number] DOB: 07-06-1935 Today's Date: 01/12/2017   History of Present Illness  Pt is a 81 y/o female admitted secondary to SOB, found to be in acute on chronic respiratory failure. PMH including but not limited to COPD, HTN and HLD.  Clinical Impression  Pt presented supine in bed with HOB elevated, awake and willing to participate in therapy session. Prior to admission, pt reported that she was independent with ADLs and ambulated with use of SPC. Pt ambulated within her room and in hallway with min guard for safety with use of RW. Pt's son present throughout session and stated that she appears to be back to her baseline. PT will continue to follow acutely to ensure a safe d/c home.    Follow Up Recommendations No PT follow up    Equipment Recommendations  None recommended by PT    Recommendations for Other Services       Precautions / Restrictions Precautions Precautions: Fall Restrictions Weight Bearing Restrictions: No      Mobility  Bed Mobility Overal bed mobility: Modified Independent                Transfers Overall transfer level: Needs assistance Equipment used: Rolling walker (2 wheeled) Transfers: Sit to/from Stand Sit to Stand: Min guard         General transfer comment: increased time, good technique, min guard for safety  Ambulation/Gait Ambulation/Gait assistance: Min guard Ambulation Distance (Feet): 100 Feet Assistive device: Rolling walker (2 wheeled) Gait Pattern/deviations: Step-through pattern;Decreased stride length;Trunk flexed Gait velocity: decreased Gait velocity interpretation: at or above normal speed for age/gender General Gait Details: mild instability but no overt LOB or need for physical assistance, min guard for safety with use of RW  Stairs            Wheelchair Mobility    Modified Rankin (Stroke Patients Only)       Balance Overall balance  assessment: Needs assistance Sitting-balance support: Feet supported Sitting balance-Leahy Scale: Good     Standing balance support: During functional activity;No upper extremity supported Standing balance-Leahy Scale: Fair                               Pertinent Vitals/Pain Pain Assessment: No/denies pain    Home Living Family/patient expects to be discharged to:: Private residence Living Arrangements: Children Available Help at Discharge: Family;Available 24 hours/day Type of Home: House Home Access: Stairs to enter Entrance Stairs-Rails: Psychiatric nurse of Steps: 2 Home Layout: One level Home Equipment: Walker - 2 wheels;Walker - 4 wheels;Cane - single point;Bedside commode      Prior Function Level of Independence: Independent with assistive device(s)         Comments: ambulated with use of SPC     Hand Dominance   Dominant Hand: Right    Extremity/Trunk Assessment   Upper Extremity Assessment Upper Extremity Assessment: Overall WFL for tasks assessed    Lower Extremity Assessment Lower Extremity Assessment: Overall WFL for tasks assessed       Communication   Communication: No difficulties  Cognition Arousal/Alertness: Awake/alert Behavior During Therapy: WFL for tasks assessed/performed Overall Cognitive Status: Within Functional Limits for tasks assessed  General Comments      Exercises     Assessment/Plan    PT Assessment Patient needs continued PT services  PT Problem List Decreased balance;Decreased mobility;Decreased coordination;Decreased knowledge of use of DME;Decreased safety awareness       PT Treatment Interventions DME instruction;Gait training;Stair training;Balance training;Neuromuscular re-education;Functional mobility training;Therapeutic activities;Therapeutic exercise;Patient/family education    PT Goals (Current goals can be found in the  Care Plan section)  Acute Rehab PT Goals Patient Stated Goal: return home PT Goal Formulation: With patient/family Time For Goal Achievement: 01/26/17 Potential to Achieve Goals: Good    Frequency Min 3X/week   Barriers to discharge        Co-evaluation               AM-PAC PT "6 Clicks" Daily Activity  Outcome Measure Difficulty turning over in bed (including adjusting bedclothes, sheets and blankets)?: None Difficulty moving from lying on back to sitting on the side of the bed? : None Difficulty sitting down on and standing up from a chair with arms (e.g., wheelchair, bedside commode, etc,.)?: Total Help needed moving to and from a bed to chair (including a wheelchair)?: None Help needed walking in hospital room?: A Little Help needed climbing 3-5 steps with a railing? : A Little 6 Click Score: 19    End of Session Equipment Utilized During Treatment: Gait belt Activity Tolerance: Patient tolerated treatment well Patient left: in bed;with call bell/phone within reach;with family/visitor present Nurse Communication: Mobility status PT Visit Diagnosis: Other abnormalities of gait and mobility (R26.89)    Time: 3570-1779 PT Time Calculation (min) (ACUTE ONLY): 12 min   Charges:   PT Evaluation $PT Eval Moderate Complexity: 1 Mod     PT G Codes:        Trilla, PT, DPT [place] 01/12/2017, 5:22 PM

## 2017-01-12 NOTE — Consult Note (Signed)
[facility] CM Primary Care Navigator  01/12/2017  Kristina Watson [birth year] [id number]   Met with patient and son Kristina Severin) at the bedside to identify possible discharge needs.  Patient stateshaving "difficulty breathing" that had led to this admission. Patient endorses Dr. Jani Gravel with [facility] as theprimary care provider.   Patient reportsusing Stage manager on [employer]) to obtain medications without any problem.   Patient states managing her medications at home with son's assistance and taking it straight out of the containers (not taking much).   Patient mentioned that her church friend Kristina Ruddle) or daughter Kristina Bal) providestransportation toher doctors'appointments.  Patient's son lives with her and serves as the primary caregiver at home as stated. Daughter also provides assistance with care if needed.    Anticipateddischarge plan ishome according to son.  Patient and son expressedunderstanding to call herprimary care provider's officewhen she gets home,for a post discharge follow-upwithin a week or sooner if needed.Patient letter (with PCP's contact number) was provided as a reminder.  Discussed withpatient and son regardingTHN CM services available for health management (particularly COPD). Explained to patient and son about COPD action plan as well. Patient opted and verbally agreed toEMMI COPDcalls to helpmanage at home and follow-up while she is recovering.   [facility] was [facility] for [facility] COPD[facility] after discharge.  Patient and son voicedunderstandingto seek referral from primary care provider to [facility] care management if deemed necessaryfor further services in the future.  [facility] care management information provided for future needs that may arise.  For questions, please contact:  Dannielle Huh, BSN, RN- [facility] Primary Care Navigator  Telephone: [phone] [place]

## 2017-01-12 NOTE — Progress Notes (Addendum)
PROGRESS NOTE  Kristina Watson YCX:[id number] DOB: April 16, 1936 DOA: 01/09/2017 PCP: Jani Gravel, MD  HPI/Recap of past 24 hours: Some dry cough, feeling congested, wheezing improved, no fever, no edema, son at bedside She report hot weather cause exacerbation  Report never smoker but with second hand smoking history Son at bedside   Assessment/Plan: Active Problems:   DOE (dyspnea on exertion)   Hypertension   Hyperlipidemia   Pulmonary embolism (HCC)   Obesity (BMI 35.0-39.9 without comorbidity)   Asthma with COPD (chronic obstructive pulmonary disease) ([place])   Acute on chronic respiratory failure with hypoxia (HCC)   -Acute COPD exacerbation  vs asthma exacerbation : Hx of second hand smoking for long period of time with her husband , Has much improved today, wheezing almost resolved, less cough, no cough noticed during encounter,  continue Nebs/doxy, last dose iv steroids today, start oral prednisone in am  Sputum culture in process, respiratory viral panel negative  Hx of PE: CTPE protocol is negative , unclear why she is not taking [place] .  5 RUNS of Vtach on 8/7 at 5:23 am , I have personally reviewed tele strip daily ,  Resolved on increase BB , decrease the Nubs , 2D echo with grade I diastolic dysfunction Keep mag>2, k.4 .  FTT; PT eval  Body mass index is 33.63 kg/m.  Report h/o nocturnal hypoxia on home o2 at 1.5liters during sleep.  Code Status: full  Family Communication: patient and son at bedside  Disposition Plan: pending PT eval, likely home with home health?   Consultants:  none  Procedures:  none  Antibiotics:  doxycycline   Objective: BP (!) 132/57 (BP Location: Right Arm)   Pulse 82   Temp 98.2 F (36.8 C) (Oral)   Resp 20   Ht 5\' 9"  (1.753 m)   Wt 103.3 kg (227 lb 11.8 oz)   SpO2 93%   BMI 33.63 kg/m   Intake/Output Summary (Last 24 hours) at 01/12/17 1801 Last data filed at 01/12/17 1300  Gross per 24 hour  Intake               522 ml  Output             2100 ml  Net            -1578 ml   Filed Weights   01/10/17 0501 01/11/17 0455 01/12/17 0100  Weight: 102.9 kg (226 lb 13.7 oz) 104.9 kg (231 lb 4.2 oz) 103.3 kg (227 lb 11.8 oz)    Exam: Patient is examined daily including today on 01/12/2017, exam remain the same as of yesterday except that is highlighted below   General:  NAD, no cough noticed during encounter  Cardiovascular: RRR  Respiratory: mild scattered wheezing noticed from yesterday has resolved during exam this am  Abdomen: Soft/ND/NT, positive BS  Musculoskeletal: No Edema  Neuro: aaox3  Data Reviewed: Basic Metabolic Panel:  Recent Labs Lab 01/09/17 0415 01/10/17 0343 01/10/17 0642 01/12/17 0507  NA 141 141  --  139  K 3.2* 4.5  --  5.6*  CL 103 105  --  102  CO2 27 29  --  26  GLUCOSE 138* 152*  --  127*  BUN 17 19  --  29*  CREATININE 0.70 0.84  --  0.76  CALCIUM 9.5 9.8  --  9.9  MG  --   --  2.1 2.1   Liver Function Tests:  Recent Labs Lab 01/10/17 0343  AST 24  ALT 17  ALKPHOS 62  BILITOT 0.8  PROT 7.2  ALBUMIN 3.4*   No results for input(s): LIPASE, AMYLASE in the last 168 hours. No results for input(s): AMMONIA in the last 168 hours. CBC:  Recent Labs Lab 01/09/17 0415 01/10/17 0343 01/12/17 0507  WBC 8.0 12.9* 11.2*  NEUTROABS 3.9  --   --   HGB 14.3 13.0 13.5  HCT 43.3 40.3 42.9  MCV 92.5 92.4 93.9  PLT 284 273 220   Cardiac Enzymes:    Recent Labs Lab 01/09/17 0710  TROPONINI <0.03   BNP (last 3 results)  Recent Labs  01/09/17 0415  BNP 118.3*    ProBNP (last 3 results) No results for input(s): PROBNP in the last 8760 hours.  CBG:  Recent Labs Lab 01/10/17 2039 01/11/17 0822 01/11/17 2046 01/12/17 0809  GLUCAP 171* 139* 159* 139*    Recent Results (from the past 240 hour(s))  Respiratory Panel by PCR     Status: None   Collection Time: 01/11/17  4:16 AM  Result Value Ref Range Status   Adenovirus NOT DETECTED  NOT DETECTED Final   Coronavirus 229E NOT DETECTED NOT DETECTED Final   Coronavirus HKU1 NOT DETECTED NOT DETECTED Final   Coronavirus NL63 NOT DETECTED NOT DETECTED Final   Coronavirus OC43 NOT DETECTED NOT DETECTED Final   Metapneumovirus NOT DETECTED NOT DETECTED Final   Rhinovirus / Enterovirus NOT DETECTED NOT DETECTED Final   Influenza A NOT DETECTED NOT DETECTED Final   Influenza B NOT DETECTED NOT DETECTED Final   Parainfluenza Virus 1 NOT DETECTED NOT DETECTED Final   Parainfluenza Virus 2 NOT DETECTED NOT DETECTED Final   Parainfluenza Virus 3 NOT DETECTED NOT DETECTED Final   Parainfluenza Virus 4 NOT DETECTED NOT DETECTED Final   Respiratory Syncytial Virus NOT DETECTED NOT DETECTED Final   Bordetella pertussis NOT DETECTED NOT DETECTED Final   Chlamydophila pneumoniae NOT DETECTED NOT DETECTED Final   Mycoplasma pneumoniae NOT DETECTED NOT DETECTED Final  MRSA PCR Screening     Status: None   Collection Time: 01/11/17  8:41 AM  Result Value Ref Range Status   MRSA by PCR NEGATIVE NEGATIVE Final    Comment:        The GeneXpert MRSA Assay (FDA approved for NASAL specimens only), is one component of a comprehensive MRSA colonization surveillance program. It is not intended to diagnose MRSA infection nor to guide or monitor treatment for MRSA infections.   Culture, expectorated sputum-assessment     Status: None   Collection Time: 01/11/17  8:41 AM  Result Value Ref Range Status   Specimen Description Expect. Sput  Final   Special Requests NONE  Final   Sputum evaluation THIS SPECIMEN IS ACCEPTABLE FOR SPUTUM CULTURE  Final   Report Status 01/11/2017 FINAL  Final  Culture, respiratory (NON-Expectorated)     Status: None (Preliminary result)   Collection Time: 01/11/17  8:41 AM  Result Value Ref Range Status   Specimen Description EXPECTORATED SPUTUM  Final   Special Requests NONE Reflexed from [id number]  Final   Gram Stain   Final    FEW WBC PRESENT, PREDOMINANTLY  PMN ABUNDANT GRAM POSITIVE RODS MODERATE GRAM POSITIVE COCCI IN PAIRS IN CHAINS MODERATE GRAM NEGATIVE RODS FEW GRAM NEGATIVE COCCOBACILLI    Culture CULTURE REINCUBATED FOR BETTER GROWTH  Final   Report Status PENDING  Incomplete     Studies: No results found.  Scheduled Meds: . doxycycline  100 mg  Oral Q12H  . enoxaparin (LOVENOX) injection  40 mg Subcutaneous Q24H  . fluticasone  1 spray Each Nare Daily  . guaiFENesin  600 mg Oral BID  . ipratropium-albuterol  3 mL Nebulization TID  . loratadine  10 mg Oral Daily  . methylPREDNISolone (SOLU-MEDROL) injection  60 mg Intravenous Q12H  . nebivolol  10 mg Oral Daily  . pantoprazole  40 mg Oral Daily    Continuous Infusions:   Time spent: 58mins I have personally reviewed and interpreted daily labs, tele strips as discussed above under date review session and assessment and plans.  Antwon Rochin MD, PhD  [facility] Pager [phone]. If 7PM-7AM, please contact night-coverage at [email or web address], password [facility] 01/12/2017, 6:01 PM  LOS: 1 day

## 2017-01-13 LAB — CULTURE, RESPIRATORY

## 2017-01-13 LAB — CBC
HEMATOCRIT: 43.4 % (ref 36.0–46.0)
Hemoglobin: 13.8 g/dL (ref 12.0–15.0)
MCH: 29.6 pg (ref 26.0–34.0)
MCHC: 31.8 g/dL (ref 30.0–36.0)
MCV: 93.1 fL (ref 78.0–100.0)
Platelets: 286 10*3/uL (ref 150–400)
RBC: 4.66 MIL/uL (ref 3.87–5.11)
RDW: 13.7 % (ref 11.5–15.5)
WBC: 10.8 10*3/uL — ABNORMAL HIGH (ref 4.0–10.5)

## 2017-01-13 LAB — BASIC METABOLIC PANEL
Anion gap: 7 (ref 5–15)
BUN: 36 mg/dL — AB (ref 6–20)
CO2: 31 mmol/L (ref 22–32)
Calcium: 9.9 mg/dL (ref 8.9–10.3)
Chloride: 100 mmol/L — ABNORMAL LOW (ref 101–111)
Creatinine, Ser: 0.87 mg/dL (ref 0.44–1.00)
GFR calc Af Amer: >60 mL/min (ref >60)
GFR calc non Af Amer: >60 mL/min (ref >60)
Glucose, Bld: 135 mg/dL — ABNORMAL HIGH (ref 65–99)
POTASSIUM: 4.5 mmol/L (ref 3.5–5.1)
SODIUM: 138 mmol/L (ref 135–145)

## 2017-01-13 LAB — GLUCOSE, CAPILLARY: GLUCOSE-CAPILLARY: 108 mg/dL — AB (ref 65–99)

## 2017-01-13 LAB — CULTURE, RESPIRATORY W GRAM STAIN: Culture: NORMAL

## 2017-01-13 MED ORDER — PREDNISONE 10 MG PO TABS: ORAL_TABLET | ORAL | 0 refills | Status: DC

## 2017-01-13 MED ORDER — FLUTICASONE PROPIONATE 50 MCG/ACT NA SUSP: 1.0000 | Freq: Two times a day (BID) | NASAL | 0 refills | Status: DC

## 2017-01-13 MED ORDER — IPRATROPIUM-ALBUTEROL 0.5-2.5 (3) MG/3ML IN SOLN: 3.0000 mL | Freq: Four times a day (QID) | RESPIRATORY_TRACT | Status: DC | PRN

## 2017-01-13 MED ORDER — SENNOSIDES-DOCUSATE SODIUM 8.6-50 MG PO TABS
1.0000 | ORAL_TABLET | Freq: Every day | ORAL | 0 refills | Status: DC
Start: 2017-01-13 — End: 2020-05-10

## 2017-01-13 MED ORDER — DOXYCYCLINE HYCLATE 100 MG PO TABS: 100.0000 mg | ORAL_TABLET | Freq: Two times a day (BID) | ORAL | 0 refills | Status: AC

## 2017-01-13 MED ORDER — ALBUTEROL SULFATE HFA 108 (90 BASE) MCG/ACT IN AERS: 2.0000 | INHALATION_SPRAY | Freq: Four times a day (QID) | RESPIRATORY_TRACT | 0 refills | Status: AC | PRN

## 2017-01-13 MED ORDER — GUAIFENESIN ER 600 MG PO TB12: 600.0000 mg | ORAL_TABLET | Freq: Two times a day (BID) | ORAL | 0 refills | Status: DC

## 2017-01-13 NOTE — Discharge Summary (Addendum)
Discharge Summary  Kristina Watson TIW:[id number] DOB: 1935/11/13  PCP: Jani Gravel, MD  Admit date: 01/09/2017 Discharge date: 01/13/2017  Time spent: <39mins  Recommendations for Outpatient Follow-up:  1. F/u with PMD within a week  for hospital discharge follow up, repeat cbc/bmp at follow up  Discharge Diagnoses:  Active Hospital Problems   Diagnosis Date Noted  . Acute on chronic respiratory failure with hypoxia ([place]) 01/09/2017  . DOE (dyspnea on exertion) 09/01/2011    Class: Hospitalized for  . Hypertension   . Hyperlipidemia     Class: Diagnosis of  . Pulmonary embolism (HCC)     Class: History of  . Obesity (BMI 35.0-39.9 without comorbidity)     Class: Chronic  . Asthma with COPD (chronic obstructive pulmonary disease) (HCC)     Class: Diagnosis of    Resolved Hospital Problems   Diagnosis Date Noted Date Resolved  No resolved problems to display.    Discharge Condition: stable  Diet recommendation: heart healthy  Filed Weights   01/11/17 0455 01/12/17 0100 01/13/17 0448  Weight: 104.9 kg (231 lb 4.2 oz) 103.3 kg (227 lb 11.8 oz) 102.7 kg (226 lb 6.6 oz)    History of present illness:  PCP: Jani Gravel, MD   Patient coming from:  Home    Chief Complaint: Shortness of breath   HPI: Kristina Watson is a 81 y.o. female with medical history significant for prior pulmonary embolism in 2005, COPD,  on O2  at 1-1.5 L prn at home, presenting via EMS with several our history of gradually worsening cough, wheezing, increasing shortness of breath. She denies any fever, hemoptysis, nausea or vomiting. She tried albuterol at home, without significant relief. She never had similar symptoms before, or had been hospitalized for COPD exacerbation. No fever.Denies any chest pain, chest wall pain or palpitations.Denies any sick contacts or recent long distant travels. Denies any abdominal pain. Denies lower extremity swelling or calf pain . No confusion was reported. Denies  any vision changes or headaches.  No tobacco or recreational drug use.   ED Course:  BP 109/67 (BP Location: Left Arm)   Pulse (!) 109   Temp 98.4 F (36.9 C)   Resp (!) 24   Ht 5\' 9"  (1.753 m)   Wt 104.8 kg (231 lb)   SpO2 93%   BMI 34.11 kg/m    sodium 141 potassium 3.2 glucose 138 creatinine 0.7 BNP118.3 Tn 0.03 WBC 8.0 , Hb 14, PLt 284  DDmer 2.34  PT 12.8/INR 0.94 CT chest Negative for pulmonary embolism  Hospital Course:  Active Problems:   DOE (dyspnea on exertion)   Hypertension   Hyperlipidemia   Pulmonary embolism (HCC)   Obesity (BMI 35.0-39.9 without comorbidity)   Asthma with COPD (chronic obstructive pulmonary disease) (HCC)   Acute on chronic respiratory failure with hypoxia (HCC)   Acute COPD exacerbation  vs asthma exacerbation : Hx of second hand smoking for long period of time with her husband , Sputum culture with normal flora, respiratory viral panel negative Has much improved today, wheezing has resolved at discharge, less cough She is discharged on prednisone taper and doxycycline to finish treatment course. She is to follow up with pmd closely  Hx of PE/DVT: CTPE protocol is negative this hospitalization. -Patient denies h/o PE but per prior imaging she did have PE in 2005,  pe has resolved per ctA chest  done in 2006, , -Per patient she had one time clot in her leg in  2005, she was treated with blood thinner for a short while and stopped. She is advised to follow closely with pmd, should she developed recurrent clot she will be life long anticoagulation if no contraindication, patient and son at bedside , they expressed understanding -note, warfarin listed as allergy, she could not provide details  NSVT:  5 RUNS of Vtach on 8/7 at 5:23 am , I have personally reviewed tele strip daily ,  Resolved on  BB , decrease the Nubs , 2D echo with grade I diastolic dysfunction, euvolemic, no edema at discharge Keep mag>2, k.4 .  FTT;she is seen by PT  , no need of hoe PT   Body mass index is 33.63 kg/m.  Report h/o nocturnal hypoxia on home o2 at 1.5liters during sleep.  Code Status: full  Family Communication: patient and son at bedside  Disposition Plan: home  (no need of PT after PT eval, please see pt notes for details)   Consultants:  none  Procedures:  none  Antibiotics:  doxycycline   Discharge Exam: BP (!) 150/75   Pulse 74   Temp 98.2 F (36.8 C) (Oral)   Resp 18   Ht 5\' 9"  (1.753 m)   Wt 102.7 kg (226 lb 6.6 oz)   SpO2 95%   BMI 33.44 kg/m   General: NAD, aaox3 Cardiovascular: RRR Respiratory: no wheezing, good aeration  Abdomen: soft Extremity: no edema  Discharge Instructions You were cared for by a hospitalist during your hospital stay. If you have any questions about your discharge medications or the care you received while you were in the hospital after you are discharged, you can call the unit and asked to speak with the hospitalist on call if the hospitalist that took care of you is not available. Once you are discharged, your primary care physician will handle any further medical issues. Please note that NO REFILLS for any discharge medications will be authorized once you are discharged, as it is imperative that you return to your primary care physician (or establish a relationship with a primary care physician if you do not have one) for your aftercare needs so that they can reassess your need for medications and monitor your lab values.  Discharge Instructions    Diet - low sodium heart healthy    Complete by:  As directed    Increase activity slowly    Complete by:  As directed      Allergies as of 01/13/2017      Reactions   Warfarin Sodium Nausea And Vomiting   Bothers stomach      Medication List    TAKE these medications   acetaminophen 500 MG tablet Commonly known as:  TYLENOL Take 500 mg by mouth every 6 (six) hours as needed for mild pain. What changed:  Another  medication with the same name was removed. Continue taking this medication, and follow the directions you see here.   albuterol (2.5 MG/3ML) 0.083% nebulizer solution Commonly known as:  PROVENTIL Take 2.5 mg by nebulization 2 (two) times daily.   albuterol 108 (90 Base) MCG/ACT inhaler Commonly known as:  PROVENTIL HFA;VENTOLIN HFA Inhale 2 puffs into the lungs every 6 (six) hours as needed for wheezing or shortness of breath.   BYSTOLIC 10 MG tablet Generic drug:  nebivolol TAKE 1 TABLET BY MOUTH DAILY** NEED APPOINTMENT**   cetirizine 10 MG tablet Commonly known as:  ZYRTEC Take 10 mg by mouth at bedtime.   doxycycline 100 MG tablet Commonly  known as:  VIBRA-TABS Take 1 tablet (100 mg total) by mouth every 12 (twelve) hours.   fluticasone 50 MCG/ACT nasal spray Commonly known as:  FLONASE Place 1 spray into both nostrils 2 (two) times daily.   guaiFENesin 600 MG 12 hr tablet Commonly known as:  MUCINEX Take 1 tablet (600 mg total) by mouth 2 (two) times daily.   predniSONE 10 MG tablet Commonly known as:  DELTASONE Label  & dispense according to the schedule below. 5 Pills PO on day one then, 4 Pills PO on day two, 3 Pills PO on day three, 2Pills PO on day four, 1 Pills PO on day five, then STOP.   senna-docusate 8.6-50 MG tablet Commonly known as:  Senokot-S Take 1 tablet by mouth at bedtime.   tetrahydrozoline-zinc 0.05-0.25 % ophthalmic solution Commonly known as:  VISINE-AC Place 2 drops into both eyes 3 (three) times daily as needed. For dry eyes      Allergies  Allergen Reactions  . Warfarin Sodium Nausea And Vomiting    Bothers stomach   Follow-up Information    Jani Gravel, MD Follow up in 1 week(s).   Specialty:  Internal Medicine Why:  hospital discharge follow up Contact information: [place]  [address] [phone]            The results of significant diagnostics from this hospitalization (including  imaging, microbiology, ancillary and laboratory) are listed below for reference.    Significant Diagnostic Studies: Dg Chest 2 View  Result Date: 01/09/2017 CLINICAL DATA:  Acute onset of shortness of breath. Initial encounter. EXAM: CHEST  2 VIEW COMPARISON:  Chest radiograph and CTA of the chest performed 08/11/2011 FINDINGS: The lungs are hypoexpanded. Left basilar airspace opacity may reflect atelectasis or pneumonia. Chronic peribronchial thickening is noted. There is no evidence of pleural effusion or pneumothorax. The heart is normal in size; the mediastinal contour is within normal limits. No acute osseous abnormalities are seen. IMPRESSION: Lungs hypoexpanded. Left basilar airspace opacity may reflect atelectasis or pneumonia. Chronic peribronchial thickening noted. Electronically Signed   By: Garald Balding M.D.   On: 01/09/2017 05:05   Ct Angio Chest Pe W And/or Wo Contrast  Result Date: 01/09/2017 CLINICAL DATA:  Shortness of breath. EXAM: CT ANGIOGRAPHY CHEST WITH CONTRAST TECHNIQUE: Multidetector CT imaging of the chest was performed using the standard protocol during bolus administration of intravenous contrast. Multiplanar CT image reconstructions and MIPs were obtained to evaluate the vascular anatomy. CONTRAST:  100 cc Isovue 370 intravenous COMPARISON:  08/11/2011 FINDINGS: Cardiovascular: Satisfactory opacification of the pulmonary arteries to the segmental level. Intermittent artifact from respiratory motion; no evidence of pulmonary embolism. Normal heart size. No pericardial effusion. Tortuous aorta with atherosclerotic calcifications. Mediastinum/Nodes: Negative for adenopathy or mass. Lungs/Pleura: Low lung volumes with atelectasis greatest at the bases where the segmental bronchi are effaced. Patchy subpleural reticulation that is nonspecific in this setting. No fibrotic changes. No Kerley lines, air bronchogram, effusion or pneumothorax. Upper Abdomen: No acute finding. Partly seen  the gallbladder is full and contains a gallstone. Musculoskeletal: Partly seen subdermal mass superior to the right shoulder is likely a attributed to dermal inclusion cyst given location. Spondylosis causing multilevel ankylosis. Review of the MIP images confirms the above findings. IMPRESSION: 1. Negative for pulmonary embolism. 2. Low volumes with atelectasis. 3. Cholelithiasis. 4. Aortic Atherosclerosis (ICD10-I70.0). Electronically Signed   By: Monte Fantasia M.D.   On: 01/09/2017 08:33    Microbiology: Recent Results (from the past  240 hour(s))  Respiratory Panel by PCR     Status: None   Collection Time: 01/11/17  4:16 AM  Result Value Ref Range Status   Adenovirus NOT DETECTED NOT DETECTED Final   Coronavirus 229E NOT DETECTED NOT DETECTED Final   Coronavirus HKU1 NOT DETECTED NOT DETECTED Final   Coronavirus NL63 NOT DETECTED NOT DETECTED Final   Coronavirus OC43 NOT DETECTED NOT DETECTED Final   Metapneumovirus NOT DETECTED NOT DETECTED Final   Rhinovirus / Enterovirus NOT DETECTED NOT DETECTED Final   Influenza A NOT DETECTED NOT DETECTED Final   Influenza B NOT DETECTED NOT DETECTED Final   Parainfluenza Virus 1 NOT DETECTED NOT DETECTED Final   Parainfluenza Virus 2 NOT DETECTED NOT DETECTED Final   Parainfluenza Virus 3 NOT DETECTED NOT DETECTED Final   Parainfluenza Virus 4 NOT DETECTED NOT DETECTED Final   Respiratory Syncytial Virus NOT DETECTED NOT DETECTED Final   Bordetella pertussis NOT DETECTED NOT DETECTED Final   Chlamydophila pneumoniae NOT DETECTED NOT DETECTED Final   Mycoplasma pneumoniae NOT DETECTED NOT DETECTED Final  MRSA PCR Screening     Status: None   Collection Time: 01/11/17  8:41 AM  Result Value Ref Range Status   MRSA by PCR NEGATIVE NEGATIVE Final    Comment:        The GeneXpert MRSA Assay (FDA approved for NASAL specimens only), is one component of a comprehensive MRSA colonization surveillance program. It is not intended to diagnose  MRSA infection nor to guide or monitor treatment for MRSA infections.   Culture, expectorated sputum-assessment     Status: None   Collection Time: 01/11/17  8:41 AM  Result Value Ref Range Status   Specimen Description Expect. Sput  Final   Special Requests NONE  Final   Sputum evaluation THIS SPECIMEN IS ACCEPTABLE FOR SPUTUM CULTURE  Final   Report Status 01/11/2017 FINAL  Final  Culture, respiratory (NON-Expectorated)     Status: None   Collection Time: 01/11/17  8:41 AM  Result Value Ref Range Status   Specimen Description EXPECTORATED SPUTUM  Final   Special Requests NONE Reflexed from [id number]  Final   Gram Stain   Final    FEW WBC PRESENT, PREDOMINANTLY PMN ABUNDANT GRAM POSITIVE RODS MODERATE GRAM POSITIVE COCCI IN PAIRS IN CHAINS MODERATE GRAM NEGATIVE RODS FEW GRAM NEGATIVE COCCOBACILLI    Culture Consistent with normal respiratory flora.  Final   Report Status 01/13/2017 FINAL  Final     Labs: Basic Metabolic Panel:  Recent Labs Lab 01/09/17 0415 01/10/17 0343 01/10/17 0642 01/12/17 0507 01/13/17 0347  NA 141 141  --  139 138  K 3.2* 4.5  --  5.6* 4.5  CL 103 105  --  102 100*  CO2 27 29  --  26 31  GLUCOSE 138* 152*  --  127* 135*  BUN 17 19  --  29* 36*  CREATININE 0.70 0.84  --  0.76 0.87  CALCIUM 9.5 9.8  --  9.9 9.9  MG  --   --  2.1 2.1  --    Liver Function Tests:  Recent Labs Lab 01/10/17 0343  AST 24  ALT 17  ALKPHOS 62  BILITOT 0.8  PROT 7.2  ALBUMIN 3.4*   No results for input(s): LIPASE, AMYLASE in the last 168 hours. No results for input(s): AMMONIA in the last 168 hours. CBC:  Recent Labs Lab 01/09/17 0415 01/10/17 0343 01/12/17 0507 01/13/17 0347  WBC 8.0 12.9* 11.2* 10.8*  NEUTROABS 3.9  --   --   --   HGB 14.3 13.0 13.5 13.8  HCT 43.3 40.3 42.9 43.4  MCV 92.5 92.4 93.9 93.1  PLT 284 273 220 286   Cardiac Enzymes:  Recent Labs Lab 01/09/17 0710  TROPONINI <0.03   BNP: BNP (last 3 results)  Recent Labs   01/09/17 0415  BNP 118.3*    ProBNP (last 3 results) No results for input(s): PROBNP in the last 8760 hours.  CBG:  Recent Labs Lab 01/11/17 0822 01/11/17 2046 01/12/17 0809 01/12/17 2139 01/13/17 0755  GLUCAP 139* 159* 139* 193* 108*       Signed:  Mishel Sans MD, PhD  [facility] 01/13/2017, 2:01 PM

## 2017-01-13 NOTE — Progress Notes (Signed)
Kristina Watson to be D/C'd Home per MD order.  Discussed with the patient and all questions fully answered.  VSS, Skin clean, dry and intact without evidence of skin break down, no evidence of skin tears noted. IV catheter discontinued intact. Site without signs and symptoms of complications. Dressing and pressure applied.  An After Visit Summary was printed and given to the patient. Patient received prescription.  D/c education completed with patient/family including follow up instructions, medication list, d/c activities limitations if indicated, with other d/c instructions as indicated by MD - patient able to verbalize understanding, all questions fully answered.   Patient instructed to return to ED, call 911, or call MD for any changes in condition.   Patient escorted via [place], and D/C home via private auto.  Christoper Fabian Brittainy Bucker 01/13/2017 4:53 PM

## 2017-01-17 DIAGNOSIS — Z09 Encounter for follow-up examination after completed treatment for conditions other than malignant neoplasm: Secondary | ICD-10-CM | POA: Diagnosis not present

## 2017-01-17 DIAGNOSIS — J449 Chronic obstructive pulmonary disease, unspecified: Secondary | ICD-10-CM | POA: Diagnosis not present

## 2017-01-19 DIAGNOSIS — R42 Dizziness and giddiness: Secondary | ICD-10-CM | POA: Diagnosis not present

## 2017-01-19 DIAGNOSIS — I10 Essential (primary) hypertension: Secondary | ICD-10-CM | POA: Diagnosis not present

## 2017-01-19 DIAGNOSIS — R5382 Chronic fatigue, unspecified: Secondary | ICD-10-CM | POA: Diagnosis not present

## 2017-01-19 DIAGNOSIS — J449 Chronic obstructive pulmonary disease, unspecified: Secondary | ICD-10-CM | POA: Diagnosis not present

## 2017-01-23 DIAGNOSIS — J45998 Other asthma: Secondary | ICD-10-CM | POA: Diagnosis not present

## 2017-01-27 DIAGNOSIS — J45909 Unspecified asthma, uncomplicated: Secondary | ICD-10-CM | POA: Diagnosis not present

## 2017-02-16 DIAGNOSIS — J45998 Other asthma: Secondary | ICD-10-CM | POA: Diagnosis not present

## 2017-02-27 DIAGNOSIS — J45909 Unspecified asthma, uncomplicated: Secondary | ICD-10-CM | POA: Diagnosis not present

## 2017-03-13 DIAGNOSIS — J45998 Other asthma: Secondary | ICD-10-CM | POA: Diagnosis not present

## 2017-03-29 DIAGNOSIS — J45909 Unspecified asthma, uncomplicated: Secondary | ICD-10-CM | POA: Diagnosis not present

## 2017-04-11 DIAGNOSIS — J45998 Other asthma: Secondary | ICD-10-CM | POA: Diagnosis not present

## 2017-04-29 DIAGNOSIS — J45909 Unspecified asthma, uncomplicated: Secondary | ICD-10-CM | POA: Diagnosis not present

## 2017-05-29 DIAGNOSIS — J45909 Unspecified asthma, uncomplicated: Secondary | ICD-10-CM | POA: Diagnosis not present

## 2017-06-29 DIAGNOSIS — J45909 Unspecified asthma, uncomplicated: Secondary | ICD-10-CM | POA: Diagnosis not present

## 2017-07-19 DIAGNOSIS — J45998 Other asthma: Secondary | ICD-10-CM | POA: Diagnosis not present

## 2017-07-30 DIAGNOSIS — J45909 Unspecified asthma, uncomplicated: Secondary | ICD-10-CM | POA: Diagnosis not present

## 2017-08-14 DIAGNOSIS — M545 Low back pain: Secondary | ICD-10-CM | POA: Diagnosis not present

## 2017-08-14 DIAGNOSIS — I10 Essential (primary) hypertension: Secondary | ICD-10-CM | POA: Diagnosis not present

## 2017-08-14 DIAGNOSIS — G4762 Sleep related leg cramps: Secondary | ICD-10-CM | POA: Diagnosis not present

## 2017-08-22 DIAGNOSIS — J45998 Other asthma: Secondary | ICD-10-CM | POA: Diagnosis not present

## 2017-08-27 DIAGNOSIS — J45909 Unspecified asthma, uncomplicated: Secondary | ICD-10-CM | POA: Diagnosis not present

## 2017-09-22 DIAGNOSIS — J45998 Other asthma: Secondary | ICD-10-CM | POA: Diagnosis not present

## 2017-09-27 DIAGNOSIS — J45909 Unspecified asthma, uncomplicated: Secondary | ICD-10-CM | POA: Diagnosis not present

## 2017-10-27 DIAGNOSIS — J45909 Unspecified asthma, uncomplicated: Secondary | ICD-10-CM | POA: Diagnosis not present

## 2017-11-13 DIAGNOSIS — J45998 Other asthma: Secondary | ICD-10-CM | POA: Diagnosis not present

## 2017-11-14 DIAGNOSIS — J449 Chronic obstructive pulmonary disease, unspecified: Secondary | ICD-10-CM | POA: Diagnosis not present

## 2017-11-14 DIAGNOSIS — Z0000 Encounter for general adult medical examination without abnormal findings: Secondary | ICD-10-CM | POA: Diagnosis not present

## 2017-11-14 DIAGNOSIS — I10 Essential (primary) hypertension: Secondary | ICD-10-CM | POA: Diagnosis not present

## 2017-11-14 DIAGNOSIS — E785 Hyperlipidemia, unspecified: Secondary | ICD-10-CM | POA: Diagnosis not present

## 2017-11-21 DIAGNOSIS — I10 Essential (primary) hypertension: Secondary | ICD-10-CM | POA: Diagnosis not present

## 2017-11-21 DIAGNOSIS — R0609 Other forms of dyspnea: Secondary | ICD-10-CM | POA: Diagnosis not present

## 2017-11-21 DIAGNOSIS — E785 Hyperlipidemia, unspecified: Secondary | ICD-10-CM | POA: Diagnosis not present

## 2017-11-21 DIAGNOSIS — Z0000 Encounter for general adult medical examination without abnormal findings: Secondary | ICD-10-CM | POA: Diagnosis not present

## 2017-11-21 DIAGNOSIS — E559 Vitamin D deficiency, unspecified: Secondary | ICD-10-CM | POA: Diagnosis not present

## 2017-11-21 DIAGNOSIS — M79673 Pain in unspecified foot: Secondary | ICD-10-CM | POA: Diagnosis not present

## 2017-11-21 DIAGNOSIS — R0602 Shortness of breath: Secondary | ICD-10-CM | POA: Diagnosis not present

## 2017-11-21 DIAGNOSIS — R29898 Other symptoms and signs involving the musculoskeletal system: Secondary | ICD-10-CM | POA: Diagnosis not present

## 2017-11-21 DIAGNOSIS — R739 Hyperglycemia, unspecified: Secondary | ICD-10-CM | POA: Diagnosis not present

## 2017-11-23 DIAGNOSIS — R0609 Other forms of dyspnea: Secondary | ICD-10-CM | POA: Diagnosis not present

## 2017-11-23 DIAGNOSIS — R29898 Other symptoms and signs involving the musculoskeletal system: Secondary | ICD-10-CM | POA: Diagnosis not present

## 2017-11-27 DIAGNOSIS — J45909 Unspecified asthma, uncomplicated: Secondary | ICD-10-CM | POA: Diagnosis not present

## 2017-11-28 ENCOUNTER — Other Ambulatory Visit: Payer: Self-pay | Admitting: Internal Medicine

## 2017-11-28 DIAGNOSIS — R29898 Other symptoms and signs involving the musculoskeletal system: Secondary | ICD-10-CM

## 2017-11-29 DIAGNOSIS — R0609 Other forms of dyspnea: Secondary | ICD-10-CM | POA: Diagnosis not present

## 2017-11-29 DIAGNOSIS — R29898 Other symptoms and signs involving the musculoskeletal system: Secondary | ICD-10-CM | POA: Diagnosis not present

## 2017-11-30 DIAGNOSIS — R0609 Other forms of dyspnea: Secondary | ICD-10-CM | POA: Diagnosis not present

## 2017-11-30 DIAGNOSIS — R29898 Other symptoms and signs involving the musculoskeletal system: Secondary | ICD-10-CM | POA: Diagnosis not present

## 2017-12-01 ENCOUNTER — Ambulatory Visit
Admission: RE | Admit: 2017-12-01 | Discharge: 2017-12-01 | Disposition: A | Discharge status: Home / Self Care | Payer: Medicare Other | Source: Ambulatory Visit | Attending: Internal Medicine | Admitting: Internal Medicine

## 2017-12-01 DIAGNOSIS — R413 Other amnesia: Secondary | ICD-10-CM | POA: Diagnosis not present

## 2017-12-01 DIAGNOSIS — R29898 Other symptoms and signs involving the musculoskeletal system: Secondary | ICD-10-CM

## 2017-12-04 DIAGNOSIS — M1990 Unspecified osteoarthritis, unspecified site: Secondary | ICD-10-CM | POA: Diagnosis not present

## 2017-12-04 DIAGNOSIS — J449 Chronic obstructive pulmonary disease, unspecified: Secondary | ICD-10-CM | POA: Diagnosis not present

## 2017-12-04 DIAGNOSIS — R29898 Other symptoms and signs involving the musculoskeletal system: Secondary | ICD-10-CM | POA: Diagnosis not present

## 2017-12-04 DIAGNOSIS — I2510 Atherosclerotic heart disease of native coronary artery without angina pectoris: Secondary | ICD-10-CM | POA: Diagnosis not present

## 2017-12-04 DIAGNOSIS — Z9981 Dependence on supplemental oxygen: Secondary | ICD-10-CM | POA: Diagnosis not present

## 2017-12-04 DIAGNOSIS — Z86718 Personal history of other venous thrombosis and embolism: Secondary | ICD-10-CM | POA: Diagnosis not present

## 2017-12-04 DIAGNOSIS — I10 Essential (primary) hypertension: Secondary | ICD-10-CM | POA: Diagnosis not present

## 2017-12-04 DIAGNOSIS — R2681 Unsteadiness on feet: Secondary | ICD-10-CM | POA: Diagnosis not present

## 2017-12-04 DIAGNOSIS — Z86711 Personal history of pulmonary embolism: Secondary | ICD-10-CM | POA: Diagnosis not present

## 2017-12-04 DIAGNOSIS — Z9181 History of falling: Secondary | ICD-10-CM | POA: Diagnosis not present

## 2017-12-06 DIAGNOSIS — Z86718 Personal history of other venous thrombosis and embolism: Secondary | ICD-10-CM | POA: Diagnosis not present

## 2017-12-06 DIAGNOSIS — Z9181 History of falling: Secondary | ICD-10-CM | POA: Diagnosis not present

## 2017-12-06 DIAGNOSIS — Z9981 Dependence on supplemental oxygen: Secondary | ICD-10-CM | POA: Diagnosis not present

## 2017-12-06 DIAGNOSIS — M1990 Unspecified osteoarthritis, unspecified site: Secondary | ICD-10-CM | POA: Diagnosis not present

## 2017-12-06 DIAGNOSIS — R29898 Other symptoms and signs involving the musculoskeletal system: Secondary | ICD-10-CM | POA: Diagnosis not present

## 2017-12-06 DIAGNOSIS — J449 Chronic obstructive pulmonary disease, unspecified: Secondary | ICD-10-CM | POA: Diagnosis not present

## 2017-12-06 DIAGNOSIS — I10 Essential (primary) hypertension: Secondary | ICD-10-CM | POA: Diagnosis not present

## 2017-12-06 DIAGNOSIS — R2681 Unsteadiness on feet: Secondary | ICD-10-CM | POA: Diagnosis not present

## 2017-12-06 DIAGNOSIS — Z86711 Personal history of pulmonary embolism: Secondary | ICD-10-CM | POA: Diagnosis not present

## 2017-12-06 DIAGNOSIS — I2510 Atherosclerotic heart disease of native coronary artery without angina pectoris: Secondary | ICD-10-CM | POA: Diagnosis not present

## 2017-12-11 DIAGNOSIS — R2681 Unsteadiness on feet: Secondary | ICD-10-CM | POA: Diagnosis not present

## 2017-12-11 DIAGNOSIS — R29898 Other symptoms and signs involving the musculoskeletal system: Secondary | ICD-10-CM | POA: Diagnosis not present

## 2017-12-11 DIAGNOSIS — Z9181 History of falling: Secondary | ICD-10-CM | POA: Diagnosis not present

## 2017-12-11 DIAGNOSIS — I10 Essential (primary) hypertension: Secondary | ICD-10-CM | POA: Diagnosis not present

## 2017-12-11 DIAGNOSIS — Z86711 Personal history of pulmonary embolism: Secondary | ICD-10-CM | POA: Diagnosis not present

## 2017-12-11 DIAGNOSIS — M1990 Unspecified osteoarthritis, unspecified site: Secondary | ICD-10-CM | POA: Diagnosis not present

## 2017-12-11 DIAGNOSIS — J449 Chronic obstructive pulmonary disease, unspecified: Secondary | ICD-10-CM | POA: Diagnosis not present

## 2017-12-11 DIAGNOSIS — Z9981 Dependence on supplemental oxygen: Secondary | ICD-10-CM | POA: Diagnosis not present

## 2017-12-11 DIAGNOSIS — I2510 Atherosclerotic heart disease of native coronary artery without angina pectoris: Secondary | ICD-10-CM | POA: Diagnosis not present

## 2017-12-11 DIAGNOSIS — Z86718 Personal history of other venous thrombosis and embolism: Secondary | ICD-10-CM | POA: Diagnosis not present

## 2017-12-12 ENCOUNTER — Ambulatory Visit: Payer: Medicare Other | Admitting: Sports Medicine

## 2017-12-13 DIAGNOSIS — J45998 Other asthma: Secondary | ICD-10-CM | POA: Diagnosis not present

## 2017-12-14 DIAGNOSIS — M1990 Unspecified osteoarthritis, unspecified site: Secondary | ICD-10-CM | POA: Diagnosis not present

## 2017-12-14 DIAGNOSIS — I2510 Atherosclerotic heart disease of native coronary artery without angina pectoris: Secondary | ICD-10-CM | POA: Diagnosis not present

## 2017-12-14 DIAGNOSIS — R0609 Other forms of dyspnea: Secondary | ICD-10-CM | POA: Diagnosis not present

## 2017-12-14 DIAGNOSIS — Z86711 Personal history of pulmonary embolism: Secondary | ICD-10-CM | POA: Diagnosis not present

## 2017-12-14 DIAGNOSIS — J449 Chronic obstructive pulmonary disease, unspecified: Secondary | ICD-10-CM | POA: Diagnosis not present

## 2017-12-14 DIAGNOSIS — R29898 Other symptoms and signs involving the musculoskeletal system: Secondary | ICD-10-CM | POA: Diagnosis not present

## 2017-12-14 DIAGNOSIS — I10 Essential (primary) hypertension: Secondary | ICD-10-CM | POA: Diagnosis not present

## 2017-12-14 DIAGNOSIS — Z9981 Dependence on supplemental oxygen: Secondary | ICD-10-CM | POA: Diagnosis not present

## 2017-12-14 DIAGNOSIS — Z86718 Personal history of other venous thrombosis and embolism: Secondary | ICD-10-CM | POA: Diagnosis not present

## 2017-12-14 DIAGNOSIS — Z9181 History of falling: Secondary | ICD-10-CM | POA: Diagnosis not present

## 2017-12-14 DIAGNOSIS — R2681 Unsteadiness on feet: Secondary | ICD-10-CM | POA: Diagnosis not present

## 2017-12-19 DIAGNOSIS — Z86718 Personal history of other venous thrombosis and embolism: Secondary | ICD-10-CM | POA: Diagnosis not present

## 2017-12-19 DIAGNOSIS — Z9181 History of falling: Secondary | ICD-10-CM | POA: Diagnosis not present

## 2017-12-19 DIAGNOSIS — M1990 Unspecified osteoarthritis, unspecified site: Secondary | ICD-10-CM | POA: Diagnosis not present

## 2017-12-19 DIAGNOSIS — R29898 Other symptoms and signs involving the musculoskeletal system: Secondary | ICD-10-CM | POA: Diagnosis not present

## 2017-12-19 DIAGNOSIS — I2510 Atherosclerotic heart disease of native coronary artery without angina pectoris: Secondary | ICD-10-CM | POA: Diagnosis not present

## 2017-12-19 DIAGNOSIS — Z86711 Personal history of pulmonary embolism: Secondary | ICD-10-CM | POA: Diagnosis not present

## 2017-12-19 DIAGNOSIS — R2681 Unsteadiness on feet: Secondary | ICD-10-CM | POA: Diagnosis not present

## 2017-12-19 DIAGNOSIS — Z9981 Dependence on supplemental oxygen: Secondary | ICD-10-CM | POA: Diagnosis not present

## 2017-12-19 DIAGNOSIS — I10 Essential (primary) hypertension: Secondary | ICD-10-CM | POA: Diagnosis not present

## 2017-12-19 DIAGNOSIS — J449 Chronic obstructive pulmonary disease, unspecified: Secondary | ICD-10-CM | POA: Diagnosis not present

## 2017-12-25 DIAGNOSIS — M5126 Other intervertebral disc displacement, lumbar region: Secondary | ICD-10-CM | POA: Diagnosis not present

## 2017-12-25 DIAGNOSIS — M5432 Sciatica, left side: Secondary | ICD-10-CM | POA: Diagnosis not present

## 2017-12-25 DIAGNOSIS — M48061 Spinal stenosis, lumbar region without neurogenic claudication: Secondary | ICD-10-CM | POA: Diagnosis not present

## 2017-12-27 DIAGNOSIS — J45909 Unspecified asthma, uncomplicated: Secondary | ICD-10-CM | POA: Diagnosis not present

## 2018-01-27 DIAGNOSIS — J45909 Unspecified asthma, uncomplicated: Secondary | ICD-10-CM | POA: Diagnosis not present

## 2018-02-23 DIAGNOSIS — J45998 Other asthma: Secondary | ICD-10-CM | POA: Diagnosis not present

## 2018-02-26 DIAGNOSIS — I10 Essential (primary) hypertension: Secondary | ICD-10-CM | POA: Diagnosis not present

## 2018-02-26 DIAGNOSIS — G8929 Other chronic pain: Secondary | ICD-10-CM | POA: Diagnosis not present

## 2018-02-26 DIAGNOSIS — Z23 Encounter for immunization: Secondary | ICD-10-CM | POA: Diagnosis not present

## 2018-02-26 DIAGNOSIS — M545 Low back pain: Secondary | ICD-10-CM | POA: Diagnosis not present

## 2018-02-27 DIAGNOSIS — J45909 Unspecified asthma, uncomplicated: Secondary | ICD-10-CM | POA: Diagnosis not present

## 2018-03-20 DIAGNOSIS — J45998 Other asthma: Secondary | ICD-10-CM | POA: Diagnosis not present

## 2018-03-29 DIAGNOSIS — J45909 Unspecified asthma, uncomplicated: Secondary | ICD-10-CM | POA: Diagnosis not present

## 2018-04-29 DIAGNOSIS — J45909 Unspecified asthma, uncomplicated: Secondary | ICD-10-CM | POA: Diagnosis not present

## 2018-04-30 DIAGNOSIS — J45998 Other asthma: Secondary | ICD-10-CM | POA: Diagnosis not present

## 2018-05-25 DIAGNOSIS — J45998 Other asthma: Secondary | ICD-10-CM | POA: Diagnosis not present

## 2018-05-29 DIAGNOSIS — J45909 Unspecified asthma, uncomplicated: Secondary | ICD-10-CM | POA: Diagnosis not present

## 2018-06-29 DIAGNOSIS — J45909 Unspecified asthma, uncomplicated: Secondary | ICD-10-CM | POA: Diagnosis not present

## 2018-07-17 DIAGNOSIS — J45998 Other asthma: Secondary | ICD-10-CM | POA: Diagnosis not present

## 2018-07-24 DIAGNOSIS — R5383 Other fatigue: Secondary | ICD-10-CM | POA: Diagnosis not present

## 2018-07-24 DIAGNOSIS — R739 Hyperglycemia, unspecified: Secondary | ICD-10-CM | POA: Diagnosis not present

## 2018-07-24 DIAGNOSIS — R413 Other amnesia: Secondary | ICD-10-CM | POA: Diagnosis not present

## 2018-07-24 DIAGNOSIS — N390 Urinary tract infection, site not specified: Secondary | ICD-10-CM | POA: Diagnosis not present

## 2018-07-24 DIAGNOSIS — E559 Vitamin D deficiency, unspecified: Secondary | ICD-10-CM | POA: Diagnosis not present

## 2018-07-24 DIAGNOSIS — I10 Essential (primary) hypertension: Secondary | ICD-10-CM | POA: Diagnosis not present

## 2018-07-25 DIAGNOSIS — N390 Urinary tract infection, site not specified: Secondary | ICD-10-CM | POA: Diagnosis not present

## 2018-07-30 ENCOUNTER — Encounter: Payer: Self-pay | Admitting: Neurology

## 2018-07-30 ENCOUNTER — Other Ambulatory Visit: Payer: Self-pay | Admitting: Neurology

## 2018-07-30 ENCOUNTER — Ambulatory Visit: Payer: Medicare Other | Admitting: Neurology

## 2018-07-30 VITALS — BP 117/65 | HR 78

## 2018-07-30 DIAGNOSIS — R410 Disorientation, unspecified: Secondary | ICD-10-CM | POA: Diagnosis not present

## 2018-07-30 DIAGNOSIS — R413 Other amnesia: Secondary | ICD-10-CM | POA: Diagnosis not present

## 2018-07-30 DIAGNOSIS — R269 Unspecified abnormalities of gait and mobility: Secondary | ICD-10-CM

## 2018-07-30 DIAGNOSIS — J45909 Unspecified asthma, uncomplicated: Secondary | ICD-10-CM | POA: Diagnosis not present

## 2018-07-30 MED ORDER — LAMOTRIGINE 25 MG PO TABS: ORAL_TABLET | ORAL | 0 refills | Status: DC

## 2018-07-30 MED ORDER — LAMOTRIGINE 100 MG PO TABS: 100.0000 mg | ORAL_TABLET | Freq: Two times a day (BID) | ORAL | 11 refills | Status: DC
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
## 2018-07-31 ENCOUNTER — Telehealth: Payer: Self-pay | Admitting: *Deleted

## 2018-07-31 DIAGNOSIS — E876 Hypokalemia: Secondary | ICD-10-CM | POA: Diagnosis not present

## 2018-07-31 LAB — BASIC METABOLIC PANEL
BUN/Creatinine Ratio: 32 — ABNORMAL HIGH (ref 12–28)
BUN: 25 mg/dL (ref 8–27)
CO2: 29 mmol/L (ref 20–29)
Calcium: 10.2 mg/dL (ref 8.7–10.3)
Chloride: 102 mmol/L (ref 96–106)
Creatinine, Ser: 0.77 mg/dL (ref 0.57–1.00)
GFR calc Af Amer: 84 mL/min/{1.73_m2} (ref >59)
GFR calc non Af Amer: 73 mL/min/{1.73_m2} (ref >59)
GLUCOSE: 99 mg/dL (ref 65–99)
POTASSIUM: 3.9 mmol/L (ref 3.5–5.2)
SODIUM: 146 mmol/L — AB (ref 134–144)

## 2018-07-31 LAB — VITAMIN B12: VITAMIN B 12: 391 pg/mL (ref 232–1245)

## 2018-07-31 LAB — TSH: TSH: 0.903 u[IU]/mL (ref 0.450–4.500)

## 2018-07-31 NOTE — Telephone Encounter (Signed)
I was able to speak with her daughter, [name] (on [place]).  She is aware of the lab results below.

## 2018-07-31 NOTE — Telephone Encounter (Signed)
-----   Message from Marcial Pacas, MD sent at 07/31/2018  9:56 AM EST ----- Please call patient for no significant abnormality on laboratory evaluations

## 2018-08-06 ENCOUNTER — Telehealth: Payer: Self-pay | Admitting: *Deleted

## 2018-08-06 MED ORDER — QUETIAPINE FUMARATE 25 MG PO TABS: 25.0000 mg | ORAL_TABLET | Freq: Every day | ORAL | 5 refills | Status: DC
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
## 2018-08-07 ENCOUNTER — Telehealth: Payer: Self-pay | Admitting: Neurology

## 2018-08-07 NOTE — Telephone Encounter (Addendum)
Spoke to her daughter, Floyde Parkins (on [place]).  She is going to [place] today for potential placement. Her mother is going to need a two step PPD test along with FL2 papers completed.  She is going to call her mother's PCP to schedule an appt.  She is going to call us back, if she needs further assistance.  States the Seroquel 25mg , one tablet at bedtime did help her mother sleep better.  She is just unable to be left unattended for any length of time during the day and she is concerned about her safety.

## 2018-08-07 NOTE — Telephone Encounter (Signed)
Pt daughter(on [place]) is asking for a FL2 , she is trying to get her mother into [place] in addition to needing an FL2 she needs  2 step TB test and a diagnosis of Dementia or Alzheimer's please call

## 2018-08-13 DIAGNOSIS — Z8673 Personal history of transient ischemic attack (TIA), and cerebral infarction without residual deficits: Secondary | ICD-10-CM | POA: Diagnosis not present

## 2018-08-13 DIAGNOSIS — Z23 Encounter for immunization: Secondary | ICD-10-CM | POA: Diagnosis not present

## 2018-08-17 DIAGNOSIS — Z23 Encounter for immunization: Secondary | ICD-10-CM | POA: Diagnosis not present

## 2018-08-27 DIAGNOSIS — J449 Chronic obstructive pulmonary disease, unspecified: Secondary | ICD-10-CM | POA: Diagnosis not present

## 2018-08-27 DIAGNOSIS — I10 Essential (primary) hypertension: Secondary | ICD-10-CM | POA: Diagnosis not present

## 2018-08-28 DIAGNOSIS — J45909 Unspecified asthma, uncomplicated: Secondary | ICD-10-CM | POA: Diagnosis not present

## 2018-09-03 DIAGNOSIS — J441 Chronic obstructive pulmonary disease with (acute) exacerbation: Secondary | ICD-10-CM | POA: Diagnosis not present

## 2018-09-03 DIAGNOSIS — R0902 Hypoxemia: Secondary | ICD-10-CM | POA: Diagnosis not present

## 2018-09-03 DIAGNOSIS — J181 Lobar pneumonia, unspecified organism: Secondary | ICD-10-CM | POA: Diagnosis not present

## 2018-09-04 DIAGNOSIS — J449 Chronic obstructive pulmonary disease, unspecified: Secondary | ICD-10-CM | POA: Diagnosis not present

## 2018-09-05 DIAGNOSIS — D519 Vitamin B12 deficiency anemia, unspecified: Secondary | ICD-10-CM | POA: Diagnosis not present

## 2018-09-05 DIAGNOSIS — I10 Essential (primary) hypertension: Secondary | ICD-10-CM | POA: Diagnosis not present

## 2018-09-05 DIAGNOSIS — D649 Anemia, unspecified: Secondary | ICD-10-CM | POA: Diagnosis not present

## 2018-09-05 DIAGNOSIS — E039 Hypothyroidism, unspecified: Secondary | ICD-10-CM | POA: Diagnosis not present

## 2018-09-10 DIAGNOSIS — R0902 Hypoxemia: Secondary | ICD-10-CM | POA: Diagnosis not present

## 2018-09-10 DIAGNOSIS — J441 Chronic obstructive pulmonary disease with (acute) exacerbation: Secondary | ICD-10-CM | POA: Diagnosis not present

## 2018-09-10 DIAGNOSIS — J181 Lobar pneumonia, unspecified organism: Secondary | ICD-10-CM | POA: Diagnosis not present

## 2018-09-17 ENCOUNTER — Other Ambulatory Visit: Payer: Medicare Other

## 2018-09-28 ENCOUNTER — Ambulatory Visit: Payer: Medicare Other | Admitting: Neurology

## 2018-09-28 DIAGNOSIS — J45909 Unspecified asthma, uncomplicated: Secondary | ICD-10-CM | POA: Diagnosis not present

## 2018-10-28 DIAGNOSIS — J45909 Unspecified asthma, uncomplicated: Secondary | ICD-10-CM | POA: Diagnosis not present

## 2018-11-28 DIAGNOSIS — J45909 Unspecified asthma, uncomplicated: Secondary | ICD-10-CM | POA: Diagnosis not present

## 2018-12-28 DIAGNOSIS — J45909 Unspecified asthma, uncomplicated: Secondary | ICD-10-CM | POA: Diagnosis not present

## 2019-01-16 ENCOUNTER — Other Ambulatory Visit: Payer: Self-pay | Admitting: Neurology

## 2019-01-21 DIAGNOSIS — J441 Chronic obstructive pulmonary disease with (acute) exacerbation: Secondary | ICD-10-CM | POA: Diagnosis not present

## 2019-01-21 DIAGNOSIS — I10 Essential (primary) hypertension: Secondary | ICD-10-CM | POA: Diagnosis not present

## 2019-01-28 DIAGNOSIS — J45909 Unspecified asthma, uncomplicated: Secondary | ICD-10-CM | POA: Diagnosis not present

## 2019-02-04 DIAGNOSIS — I10 Essential (primary) hypertension: Secondary | ICD-10-CM | POA: Diagnosis not present

## 2019-02-28 DIAGNOSIS — J45909 Unspecified asthma, uncomplicated: Secondary | ICD-10-CM | POA: Diagnosis not present

## 2019-03-30 DIAGNOSIS — J45909 Unspecified asthma, uncomplicated: Secondary | ICD-10-CM | POA: Diagnosis not present

## 2019-04-22 DIAGNOSIS — R296 Repeated falls: Secondary | ICD-10-CM | POA: Diagnosis not present

## 2019-04-22 DIAGNOSIS — I10 Essential (primary) hypertension: Secondary | ICD-10-CM | POA: Diagnosis not present

## 2019-04-22 DIAGNOSIS — M6281 Muscle weakness (generalized): Secondary | ICD-10-CM | POA: Diagnosis not present

## 2019-04-30 DIAGNOSIS — J45909 Unspecified asthma, uncomplicated: Secondary | ICD-10-CM | POA: Diagnosis not present

## 2019-05-06 DIAGNOSIS — Z87440 Personal history of urinary (tract) infections: Secondary | ICD-10-CM | POA: Diagnosis not present

## 2019-05-06 DIAGNOSIS — R82998 Other abnormal findings in urine: Secondary | ICD-10-CM | POA: Diagnosis not present

## 2019-05-06 DIAGNOSIS — I10 Essential (primary) hypertension: Secondary | ICD-10-CM | POA: Diagnosis not present

## 2019-05-06 DIAGNOSIS — R410 Disorientation, unspecified: Secondary | ICD-10-CM | POA: Diagnosis not present

## 2019-05-15 DIAGNOSIS — D649 Anemia, unspecified: Secondary | ICD-10-CM | POA: Diagnosis not present

## 2019-05-15 DIAGNOSIS — E785 Hyperlipidemia, unspecified: Secondary | ICD-10-CM | POA: Diagnosis not present

## 2019-05-15 DIAGNOSIS — Z5181 Encounter for therapeutic drug level monitoring: Secondary | ICD-10-CM | POA: Diagnosis not present

## 2019-05-15 DIAGNOSIS — E119 Type 2 diabetes mellitus without complications: Secondary | ICD-10-CM | POA: Diagnosis not present

## 2019-05-16 ENCOUNTER — Other Ambulatory Visit: Payer: Self-pay | Admitting: Neurology

## 2019-05-21 ENCOUNTER — Other Ambulatory Visit: Payer: Self-pay | Admitting: Neurology

## 2019-05-30 DIAGNOSIS — J45909 Unspecified asthma, uncomplicated: Secondary | ICD-10-CM | POA: Diagnosis not present

## 2019-06-11 ENCOUNTER — Other Ambulatory Visit: Payer: Self-pay | Admitting: Neurology

## 2019-06-30 DIAGNOSIS — J45909 Unspecified asthma, uncomplicated: Secondary | ICD-10-CM | POA: Diagnosis not present

## 2019-07-23 IMAGING — CT CT ANGIO CHEST
2 of 7 series · 18 of 46 positions shown · IV contrast (APPLIED)
Comparison: 08/11/2011

CLINICAL DATA: Shortness of breath.

EXAM:
CT ANGIOGRAPHY CHEST WITH CONTRAST
TECHNIQUE: Multidetector CT imaging of the chest was performed using the
standard protocol during bolus administration of intravenous
contrast. Multiplanar CT image reconstructions and MIPs were
obtained to evaluate the vascular anatomy.
CONTRAST:  100 cc Isovue 370 intravenous

[Series 7: thins · axial · 0.59mm/px · z∈[+1393,+1645]mm · 15 of 406 slices shown]
[im 23/406  lung]
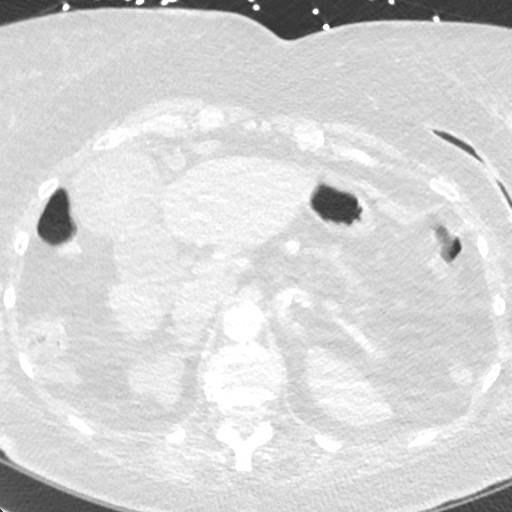
[im 46/406  soft-tissue]
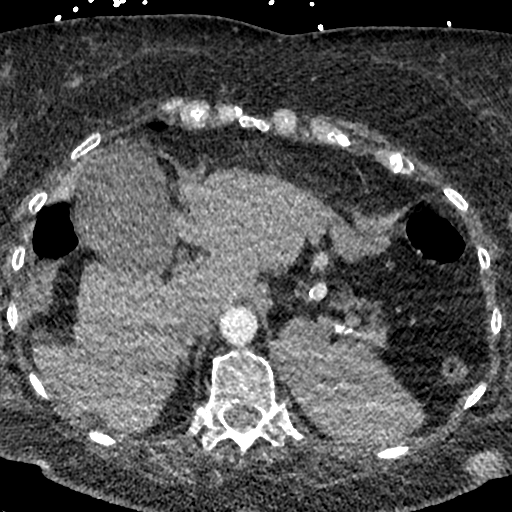
[im 68/406  lung]
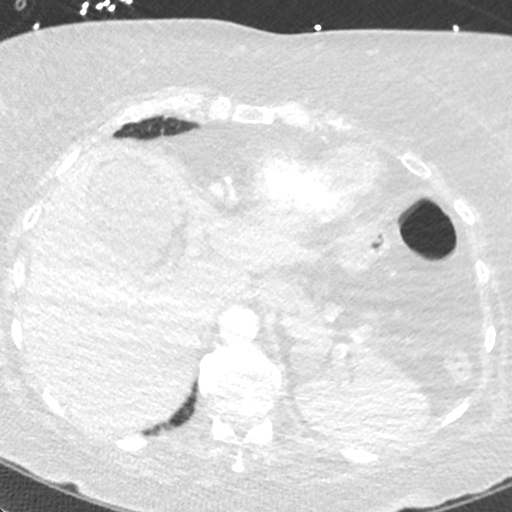
[im 91/406  soft-tissue]
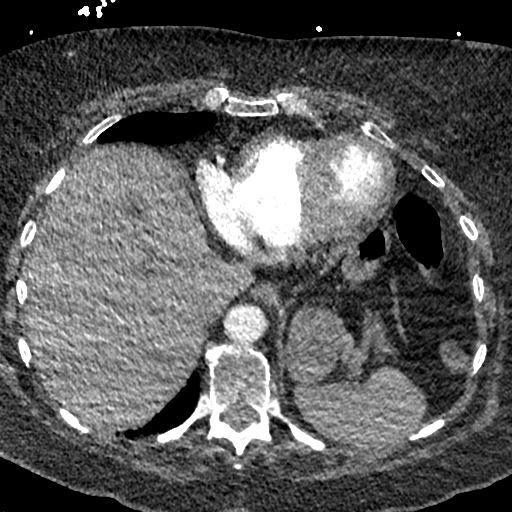
[im 136/406  lung]
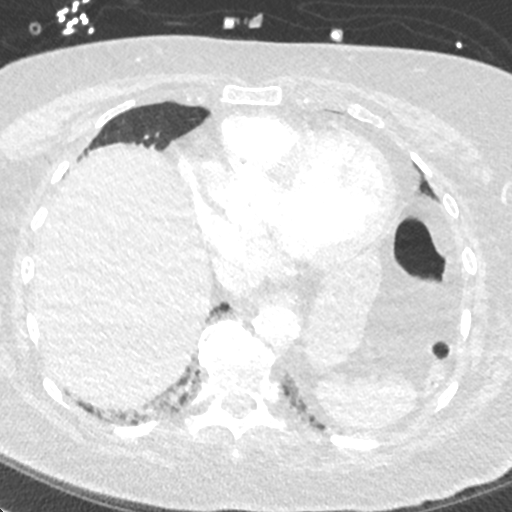
[im 158/406  soft-tissue]
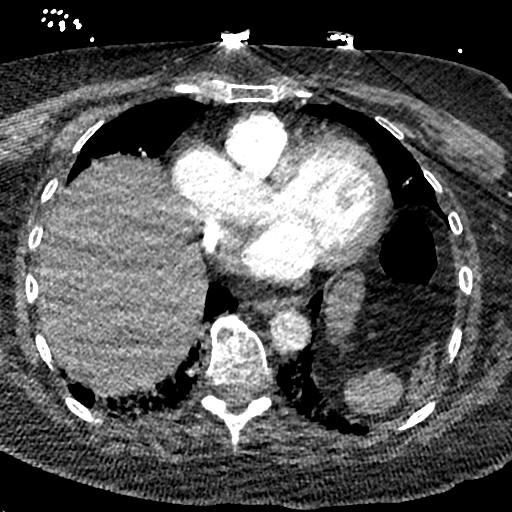
[im 181/406  lung]
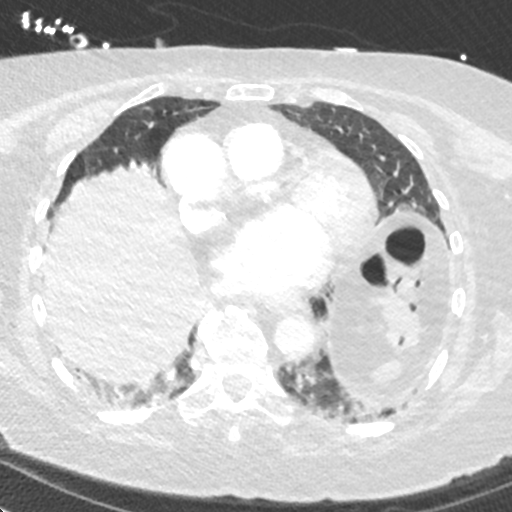
[im 203/406  soft-tissue]
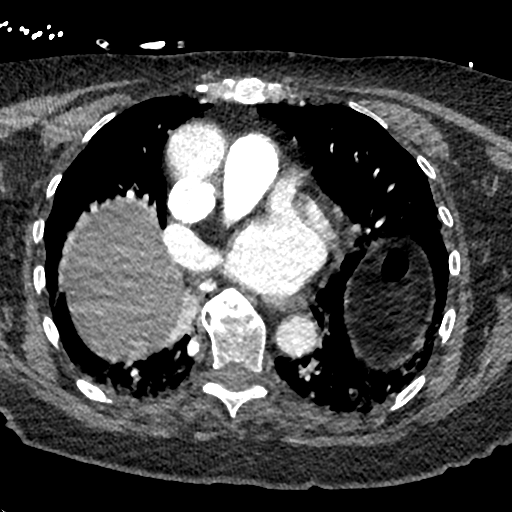
[im 226/406  lung]
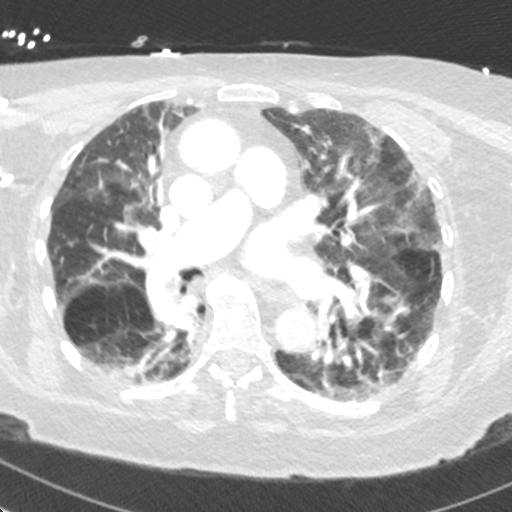
[im 248/406  soft-tissue]
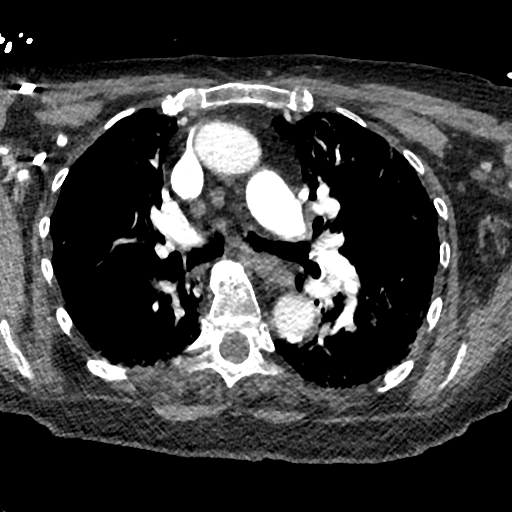
[im 271/406  lung]
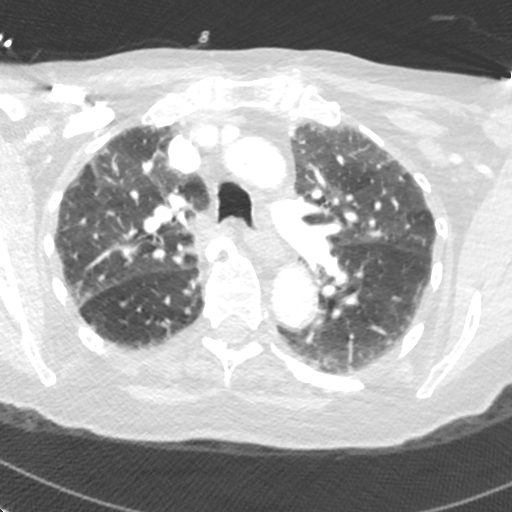
[im 316/406  soft-tissue]
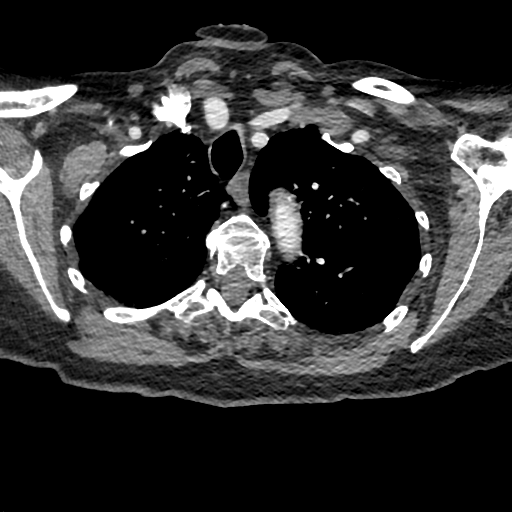
[im 338/406  lung]
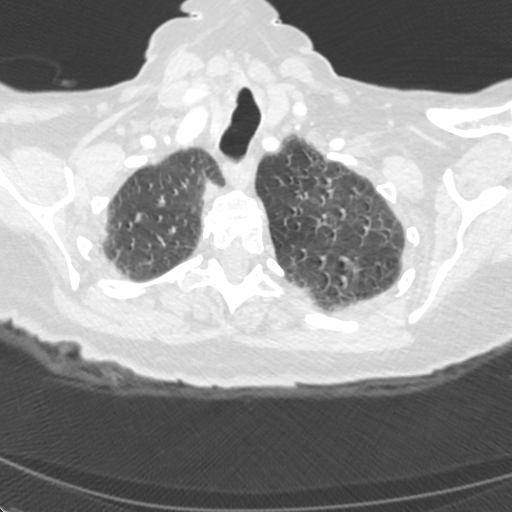
[im 361/406  soft-tissue]
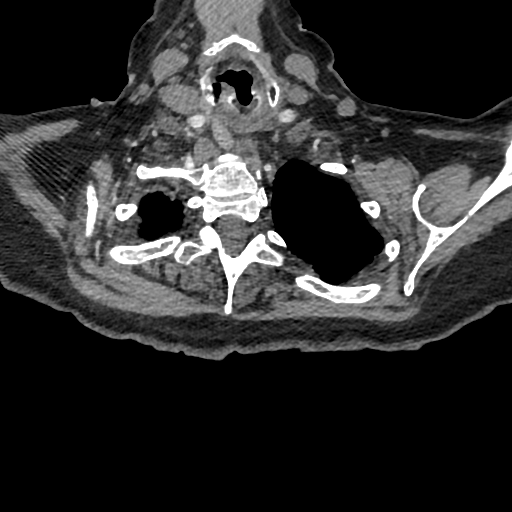
[im 383/406  lung]
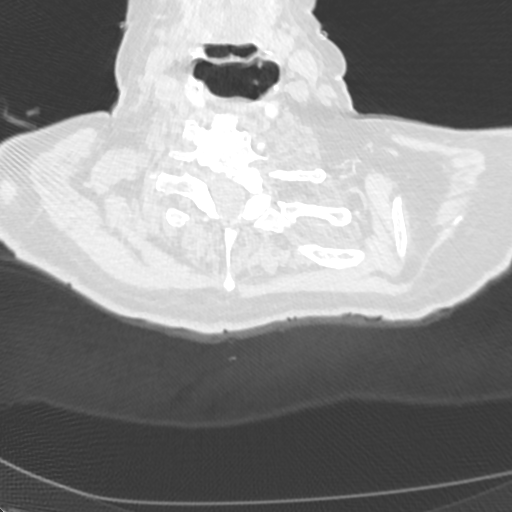

[Series 8: cor · coronal · 0.57mm/px · 3 of 121 slices shown]
[im 31/121  soft-tissue]
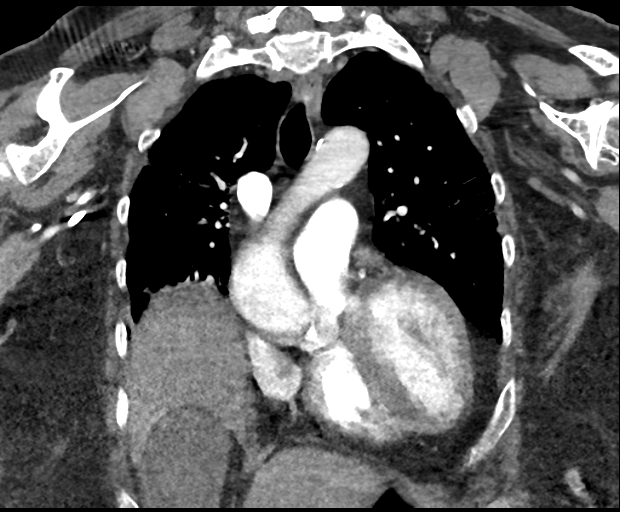
[im 61/121  soft-tissue]
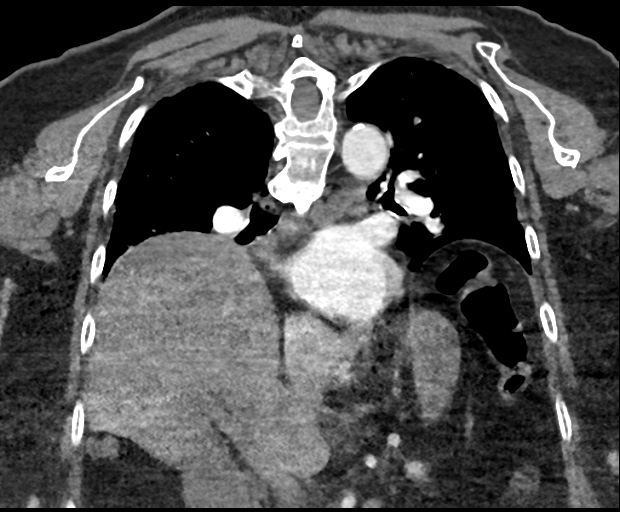
[im 91/121  soft-tissue]
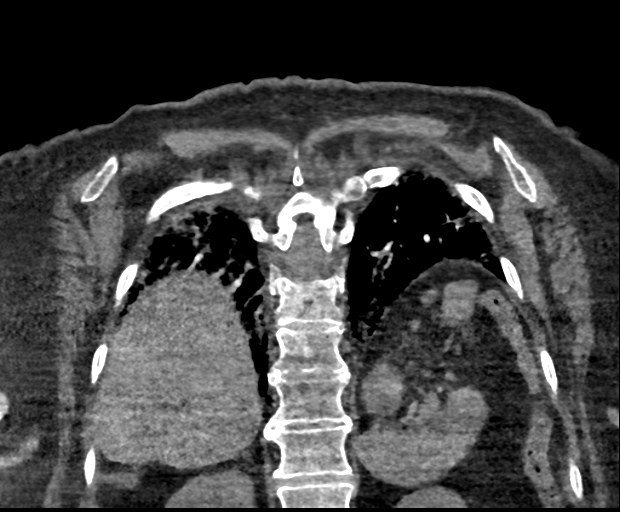

[18 of 46 positions shown; findings below may reference images not displayed]

FINDINGS: Cardiovascular: Satisfactory opacification of the pulmonary arteries
to the segmental level. Intermittent artifact from respiratory
motion; no evidence of pulmonary embolism. Normal heart size. No
pericardial effusion. Tortuous aorta with atherosclerotic
calcifications.

Mediastinum/Nodes: Negative for adenopathy or mass.

Lungs/Pleura: Low lung volumes with atelectasis greatest at the
bases where the segmental bronchi are effaced. Patchy subpleural
reticulation that is nonspecific in this setting. No fibrotic
changes. No Kerley lines, air bronchogram, effusion or pneumothorax.

Upper Abdomen: No acute finding. Partly seen the gallbladder is full
and contains a gallstone.

Musculoskeletal: Partly seen subdermal mass superior to the right
shoulder is likely a attributed to dermal inclusion cyst given
location.

Spondylosis causing multilevel ankylosis.

Review of the MIP images confirms the above findings.
IMPRESSION: 1. Negative for pulmonary embolism.
2. Low volumes with atelectasis.
3. Cholelithiasis.
4. Aortic Atherosclerosis (31642-9DC.C).

## 2019-07-27 ENCOUNTER — Emergency Department (HOSPITAL_COMMUNITY): Payer: Medicare Other

## 2019-07-27 ENCOUNTER — Emergency Department (HOSPITAL_COMMUNITY)
Admission: EM | Admit: 2019-07-27 | Discharge: 2019-07-27 | Disposition: A | Discharge status: Home / Self Care | Payer: Medicare Other | Attending: Emergency Medicine | Admitting: Emergency Medicine

## 2019-07-27 ENCOUNTER — Other Ambulatory Visit: Payer: Self-pay

## 2019-07-27 ENCOUNTER — Encounter (HOSPITAL_COMMUNITY): Payer: Self-pay

## 2019-07-27 DIAGNOSIS — W052XXA Fall from non-moving motorized mobility scooter, initial encounter: Secondary | ICD-10-CM | POA: Insufficient documentation

## 2019-07-27 DIAGNOSIS — Z79899 Other long term (current) drug therapy: Secondary | ICD-10-CM | POA: Diagnosis not present

## 2019-07-27 DIAGNOSIS — I10 Essential (primary) hypertension: Secondary | ICD-10-CM | POA: Diagnosis not present

## 2019-07-27 DIAGNOSIS — S8992XA Unspecified injury of left lower leg, initial encounter: Secondary | ICD-10-CM | POA: Diagnosis not present

## 2019-07-27 DIAGNOSIS — S0990XA Unspecified injury of head, initial encounter: Secondary | ICD-10-CM | POA: Diagnosis not present

## 2019-07-27 DIAGNOSIS — I959 Hypotension, unspecified: Secondary | ICD-10-CM | POA: Diagnosis not present

## 2019-07-27 DIAGNOSIS — Y92129 Unspecified place in nursing home as the place of occurrence of the external cause: Secondary | ICD-10-CM | POA: Insufficient documentation

## 2019-07-27 DIAGNOSIS — W19XXXA Unspecified fall, initial encounter: Secondary | ICD-10-CM | POA: Diagnosis not present

## 2019-07-27 DIAGNOSIS — Y939 Activity, unspecified: Secondary | ICD-10-CM | POA: Diagnosis not present

## 2019-07-27 DIAGNOSIS — R55 Syncope and collapse: Secondary | ICD-10-CM | POA: Diagnosis not present

## 2019-07-27 DIAGNOSIS — J449 Chronic obstructive pulmonary disease, unspecified: Secondary | ICD-10-CM | POA: Insufficient documentation

## 2019-07-27 DIAGNOSIS — S3993XA Unspecified injury of pelvis, initial encounter: Secondary | ICD-10-CM | POA: Diagnosis not present

## 2019-07-27 DIAGNOSIS — F0390 Unspecified dementia without behavioral disturbance: Secondary | ICD-10-CM | POA: Insufficient documentation

## 2019-07-27 DIAGNOSIS — I491 Atrial premature depolarization: Secondary | ICD-10-CM | POA: Diagnosis not present

## 2019-07-27 DIAGNOSIS — Y999 Unspecified external cause status: Secondary | ICD-10-CM | POA: Diagnosis not present

## 2019-07-27 DIAGNOSIS — M2550 Pain in unspecified joint: Secondary | ICD-10-CM | POA: Diagnosis not present

## 2019-07-27 DIAGNOSIS — S199XXA Unspecified injury of neck, initial encounter: Secondary | ICD-10-CM | POA: Diagnosis not present

## 2019-07-27 DIAGNOSIS — R402411 Glasgow coma scale score 13-15, in the field [EMT or ambulance]: Secondary | ICD-10-CM | POA: Diagnosis not present

## 2019-07-27 DIAGNOSIS — Z743 Need for continuous supervision: Secondary | ICD-10-CM | POA: Diagnosis not present

## 2019-07-27 DIAGNOSIS — Z7401 Bed confinement status: Secondary | ICD-10-CM | POA: Diagnosis not present

## 2019-07-27 LAB — TROPONIN I (HIGH SENSITIVITY)
Troponin I (High Sensitivity): 6 ng/L (ref <18)
Troponin I (High Sensitivity): 6 ng/L (ref <18)

## 2019-07-27 LAB — BASIC METABOLIC PANEL
Anion gap: 9 (ref 5–15)
BUN: 21 mg/dL (ref 8–23)
CO2: 28 mmol/L (ref 22–32)
Calcium: 10.2 mg/dL (ref 8.9–10.3)
Chloride: 102 mmol/L (ref 98–111)
Creatinine, Ser: 0.95 mg/dL (ref 0.44–1.00)
GFR calc Af Amer: >60 mL/min (ref >60)
GFR calc non Af Amer: 56 mL/min — ABNORMAL LOW (ref >60)
Glucose, Bld: 111 mg/dL — ABNORMAL HIGH (ref 70–99)
Potassium: 4.6 mmol/L (ref 3.5–5.1)
Sodium: 139 mmol/L (ref 135–145)

## 2019-07-27 LAB — CBC WITH DIFFERENTIAL/PLATELET
Abs Immature Granulocytes: 0.01 10*3/uL (ref 0.00–0.07)
Basophils Absolute: 0 10*3/uL (ref 0.0–0.1)
Basophils Relative: 0 %
Eosinophils Absolute: 0.2 10*3/uL (ref 0.0–0.5)
Eosinophils Relative: 3 %
HCT: 44.9 % (ref 36.0–46.0)
Hemoglobin: 14.5 g/dL (ref 12.0–15.0)
Immature Granulocytes: 0 %
Lymphocytes Relative: 24 %
Lymphs Abs: 1.8 10*3/uL (ref 0.7–4.0)
MCH: 30 pg (ref 26.0–34.0)
MCHC: 32.3 g/dL (ref 30.0–36.0)
MCV: 93 fL (ref 80.0–100.0)
Monocytes Absolute: 0.6 10*3/uL (ref 0.1–1.0)
Monocytes Relative: 8 %
Neutro Abs: 4.9 10*3/uL (ref 1.7–7.7)
Neutrophils Relative %: 65 %
Platelets: 273 10*3/uL (ref 150–400)
RBC: 4.83 MIL/uL (ref 3.87–5.11)
RDW: 13.7 % (ref 11.5–15.5)
WBC: 7.4 10*3/uL (ref 4.0–10.5)
nRBC: 0 % (ref 0.0–0.2)

## 2019-07-27 LAB — CBG MONITORING, ED: Glucose-Capillary: 101 mg/dL — ABNORMAL HIGH (ref 70–99)

## 2019-07-27 MED ORDER — LORAZEPAM 2 MG/ML IJ SOLN
INTRAMUSCULAR | Status: AC
  Filled 2019-07-27: qty 1

## 2019-07-27 MED ORDER — LORAZEPAM 2 MG/ML IJ SOLN
0.5000 mg | Freq: Once | INTRAMUSCULAR | Status: AC
  Administered 2019-07-27: 14:00:00 0.5 mg via INTRAVENOUS
  Filled 2019-07-27: qty 1

## 2019-07-27 NOTE — ED Triage Notes (Signed)
Pt arrived via [facility] from [facility] Near syncope/sliding out of chair. Witnessed event. Pt denies head neck or back pain   Per EMS pts POA is pt daughter. Paperwork at bedside.    Hx dementia COPD HTN  20G LAC

## 2019-07-27 NOTE — ED Notes (Signed)
[facility] called paperwork at bedside

## 2019-07-27 NOTE — ED Notes (Signed)
Multiple attempts to contact [facility] with no answer. Pts POA has been updated on pts condition.

## 2019-07-27 NOTE — ED Notes (Signed)
Pt transported to imaging and moved to room 4 for visibility

## 2019-07-27 NOTE — ED Provider Notes (Addendum)
[facility] DEPT Provider Note   CSN: [id number] Arrival date & time: 07/27/19  [id number]     History Chief Complaint  Patient presents with  . Near Syncope    Possible Fall     Kristina Watson is a 84 y.o. female history of dementia, obesity, COPD, hyperlipidemia, hypertension, PE.  Patient presents from nursing home today via EMS for a near syncopal episode.  Per triage note patient was witnessed sliding out of her chair today onto the floor.  No injuries reported. - On my initial evaluation patient is well-appearing no acute distress, she is alert and oriented x4, she is refusing any and all treatment he reports that she is feeling well and wants to be left alone, she is agitated at this time. -   HPI     Past Medical History:  Diagnosis Date  . Arthritis    "legs" (01/09/2017)  . Asthma with COPD (chronic obstructive pulmonary disease) ([place])   . Chronic bronchitis ([place])   . COPD (chronic obstructive pulmonary disease) ([place])   . Hyperlipidemia   . Hypertension   . Memory loss   . Multiple environmental allergies   . Obesity (BMI 35.0-39.9 without comorbidity)   . On home oxygen therapy    "1.5L usually when she sleeps" (01/09/2017)  . Osteoarthritis of shoulder   . Pneumonia    "a couple times" (8//11/2016)  . Pulmonary embolism ([place]) ?2005  . Tachycardia     Patient Active Problem List   Diagnosis Date Noted  . Memory loss 07/30/2018  . Gait abnormality 07/30/2018  . Confusion 07/30/2018  . Acute on chronic respiratory failure with hypoxia ([place]) 01/09/2017  . COPD exacerbation ([place])   . DOE (dyspnea on exertion) 09/01/2011    Class: Hospitalized for  . Hypertension   . Hyperlipidemia     Class: Diagnosis of  . Pulmonary embolism (HCC)     Class: History of  . Obesity (BMI 35.0-39.9 without comorbidity)     Class: Chronic  . Asthma with COPD (chronic obstructive pulmonary disease) (HCC)     Class: Diagnosis of  . Abnormal nuclear  stress test 08/30/2011    Class: Hospitalized for    Past Surgical History:  Procedure Laterality Date  . ABDOMINAL HYSTERECTOMY    . CARDIAC CATHETERIZATION    . CATARACT EXTRACTION, BILATERAL Bilateral   . DILATION AND CURETTAGE OF UTERUS    . LEFT HEART CATHETERIZATION WITH CORONARY ANGIOGRAM N/A 09/01/2011   Procedure: LEFT HEART CATHETERIZATION WITH CORONARY ANGIOGRAM;  Surgeon: Leonie Man, MD;  Location: [facility] CATH LAB;  Service: Cardiovascular;  Laterality: N/A;  . TONSILLECTOMY       OB History   No obstetric history on file.     Family History  Problem Relation Age of Onset  . Cancer Mother        pancreatic  . CAD Father   . Heart failure Brother        congenital heart disease  . Stroke Brother     Social History   Tobacco Use  . Smoking status: Never Smoker  . Smokeless tobacco: Never Used  . Tobacco comment: Long term second hand smoke exposure  Substance Use Topics  . Alcohol use: No  . Drug use: No    Home Medications Prior to Admission medications   Medication Sig Start Date End Date Taking? Authorizing Provider  acetaminophen (TYLENOL) 500 MG tablet Take 500 mg by mouth every 6 (six) hours as needed  for mild pain.   Yes [provider]  albuterol (PROVENTIL) (2.5 MG/3ML) 0.083% nebulizer solution Take 2.5 mg by nebulization 2 (two) times daily.   Yes [provider]  alum & mag hydroxide-simeth (MAALOX/MYLANTA) 200-200-20 MG/5ML suspension Take 30 mLs by mouth every 6 (six) hours as needed for indigestion or heartburn.   Yes [provider]  BYSTOLIC 10 MG tablet TAKE 1 TABLET BY MOUTH DAILY** NEED APPOINTMENT** Patient taking differently: Take 10 mg by mouth daily.  04/30/13  Yes Leonie Man, MD  guaifenesin (ROBITUSSIN) 100 MG/5ML syrup Take 200 mg by mouth every 6 (six) hours as needed for cough.   Yes [provider]  lamoTRIgine (LAMICTAL) 100 MG tablet Take 1 tablet (100 mg total) by mouth 2 (two) times  daily. Please call [phone] to schedule follow up. 05/21/19  Yes Marcial Pacas, MD  loperamide (IMODIUM) 2 MG capsule Take 2 mg by mouth as needed for diarrhea or loose stools.   Yes [provider]  magnesium hydroxide (MILK OF MAGNESIA) 400 MG/5ML suspension Take 30 mLs by mouth at bedtime as needed for mild constipation.   Yes [provider]  Multiple Vitamin (MULTIVITAMIN) tablet Take 1 tablet by mouth daily.   Yes [provider]  neomycin-bacitracin-polymyxin (NEOSPORIN) [phone] ointment Apply 1 application topically every 6 (six) hours as needed (abrasions).   Yes [provider]  QUEtiapine (SEROQUEL) 25 MG tablet Take 1 tablet (25 mg total) by mouth at bedtime. Please call [phone] to schedule an appt. 01/16/19  Yes Marcial Pacas, MD  Vitamin D, Cholecalciferol, 25 MCG (1000 UT) TABS Take 50 mcg by mouth daily.   Yes [provider]  albuterol (PROVENTIL HFA;VENTOLIN HFA) 108 (90 Base) MCG/ACT inhaler Inhale 2 puffs into the lungs every 6 (six) hours as needed for wheezing or shortness of breath. Patient not taking: Reported on 07/27/2019 01/13/17   Florencia Reasons, MD  guaiFENesin (MUCINEX) 600 MG 12 hr tablet Take 1 tablet (600 mg total) by mouth 2 (two) times daily. Patient not taking: Reported on 07/27/2019 01/13/17   Florencia Reasons, MD  lamoTRIgine (LAMICTAL) 25 MG tablet 1 tablet twice a day for the first week 2 tablets twice a day for the second week 3 tablets twice a day for the third week 4 tablets twice a day for the fourth week  For total of 140 tablets  After finish titration with small dose of lamotrigine 25 mg, change to lamotrigine 100 mg twice a day Patient not taking: Reported on 07/27/2019 07/30/18   Marcial Pacas, MD  OXYGEN Inhale 2 L/min into the lungs as needed.    [provider]  predniSONE (DELTASONE) 10 MG tablet Label  & dispense according to the schedule below. 5 Pills PO on day one then, 4 Pills PO on day two, 3 Pills PO on  day three, 2Pills PO on day four, 1 Pills PO on day five, then STOP. Patient not taking: Reported on 07/27/2019 01/13/17   Florencia Reasons, MD  senna-docusate (SENOKOT-S) 8.6-50 MG tablet Take 1 tablet by mouth at bedtime. Patient not taking: Reported on 07/27/2019 01/13/17   Florencia Reasons, MD    Allergies    Warfarin sodium  Review of Systems   Review of Systems  Unable to perform ROS: Dementia    Physical Exam Updated Vital Signs BP (!) 150/72   Pulse 76   Resp 20   SpO2 95%   Physical Exam Constitutional:      General: She  is not in acute distress.    Appearance: Normal appearance. She is well-developed. She is obese. She is not ill-appearing or diaphoretic.  HENT:     Head: Normocephalic and atraumatic. No raccoon eyes, Battle's sign, abrasion or contusion.     Jaw: There is normal jaw occlusion. No trismus.     Comments: No sign of intraoral injury    Right Ear: Tympanic membrane and external ear normal.     Left Ear: Tympanic membrane and external ear normal.     Nose: Nose normal. No rhinorrhea.     Right Nostril: No epistaxis.     Left Nostril: No epistaxis.     Mouth/Throat:     Mouth: Mucous membranes are moist.     Pharynx: Oropharynx is clear.  Eyes:     General: Vision grossly intact. Gaze aligned appropriately.     Extraocular Movements: Extraocular movements intact.     Conjunctiva/sclera: Conjunctivae normal.     Pupils: Pupils are equal, round, and reactive to light.  Neck:     Trachea: Trachea and phonation normal. No tracheal tenderness or tracheal deviation.  Cardiovascular:     Rate and Rhythm: Normal rate and regular rhythm.     Pulses:          Dorsalis pedis pulses are 2+ on the right side and 2+ on the left side.     Heart sounds: Normal heart sounds.  Pulmonary:     Effort: Pulmonary effort is normal. No respiratory distress.     Breath sounds: Normal breath sounds and air entry.  Chest:     Chest wall: No deformity, tenderness or crepitus.      Comments: No bruising Abdominal:     General: There is no distension.     Palpations: Abdomen is soft.     Tenderness: There is no abdominal tenderness. There is no guarding or rebound.     Comments: No bruising  Musculoskeletal:        General: Normal range of motion.     Cervical back: Full passive range of motion without pain, normal range of motion and neck supple. No spinous process tenderness or muscular tenderness.     Comments: No midline C/T/L spinal tenderness to palpation, no paraspinal muscle tenderness, no deformity, crepitus, or step-off noted. No sign of injury to the neck or back. - Pelvis stable to compression bilaterally without pain.  Patient able to raise bilateral legs without assistance or difficulty.  She has repeatedly pulled herself to a seated position from supine without assistance or difficulty. - All major joints mobilized with appropriate range of motion and strength for age without pain or deformity.  Feet:     Right foot:     Protective Sensation: 3 sites tested. 3 sites sensed.     Left foot:     Protective Sensation: 3 sites tested. 3 sites sensed.  Skin:    General: Skin is warm and dry.     Findings: No bruising, signs of injury or wound.  Neurological:     Mental Status: She is alert.     GCS: GCS eye subscore is 4. GCS verbal subscore is 5. GCS motor subscore is 6.     Comments: Speech is clear and goal oriented, follows commands Major Cranial nerves without deficit, no facial droop Normal strength in upper and lower extremities bilaterally including dorsiflexion and plantar flexion, strong and equal grip strength Sensation normal to light and sharp touch Moves extremities without ataxia, coordination  intact  Psychiatric:        Behavior: Behavior normal.    ED Results / Procedures / Treatments   Labs (all labs ordered are listed, but only abnormal results are displayed) Labs Reviewed  BASIC METABOLIC PANEL - Abnormal; Notable for the  following components:      Result Value   Glucose, Bld 111 (*)    GFR calc non Af Amer 56 (*)    All other components within normal limits  CBG MONITORING, ED - Abnormal; Notable for the following components:   Glucose-Capillary 101 (*)    All other components within normal limits  CBC WITH DIFFERENTIAL/PLATELET  TROPONIN I (HIGH SENSITIVITY)  TROPONIN I (HIGH SENSITIVITY)    EKG EKG Interpretation  Date/Time:  Saturday July 27 2019 10:59:41 EST Ventricular Rate:  73 PR Interval:    QRS Duration: 103 QT Interval:  387 QTC Calculation: 427 R Axis:   -49 Text Interpretation: Sinus rhythm Consider right atrial enlargement Incomplete RBBB and LAFB Abnormal R-wave progression, early transition Left ventricular hypertrophy since last tracing no significant change Confirmed by Daleen Bo [phone]) on 07/27/2019 11:13:24 AM   Radiology DG Pelvis 1-2 Views  Result Date: 07/27/2019 CLINICAL DATA:  Fall out of wheelchair. EXAM: PELVIS - 1-2 VIEW COMPARISON:  None. FINDINGS: Mild symmetric degenerative change of the hips. Mild diffuse decreased bone mineralization. No acute fracture or dislocation. Degenerative change of the spine. IMPRESSION: No acute findings. Electronically Signed   By: Marin Olp M.D.   On: 07/27/2019 15:10   CT Head Wo Contrast  Result Date: 07/27/2019 CLINICAL DATA:  Minor head trauma. EXAM: CT HEAD WITHOUT CONTRAST CT CERVICAL SPINE WITHOUT CONTRAST TECHNIQUE: Multidetector CT imaging of the head and cervical spine was performed following the standard protocol without intravenous contrast. Multiplanar CT image reconstructions of the cervical spine were also generated. COMPARISON:  Head CT 09/12/2008 FINDINGS: CT HEAD FINDINGS Brain: Ventricles and cisterns are within normal. There is chronic ischemic microvascular disease. Moderate region of encephalomalacia over the right frontal lobe unchanged. 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 No evidence of mass, mass  effect or shift of midline structures. No acute hemorrhage. No acute infarction. Vascular: No hyperdense vessel or unexpected calcification. Skull: Stable changes over the frontal skull likely from previous craniotomy. Sinuses/Orbits: Orbits are unremarkable. Chronic opacification over the right ethmoid air cells and mild mucosal membrane thickening over the floor the maxillary sinuses. Mastoid air cells are clear. Other: 1.6 cm well-defined oval lytic process over the body of the right mandible. CT CERVICAL SPINE FINDINGS Alignment: No evidence of acute traumatic subluxation. Subtle degenerative anterior subluxation of C4 on C5. Skull base and vertebrae: Vertebral body heights are maintained. There is mild spondylosis throughout the cervical spine to include facet arthropathy and uncovertebral joint spurring. Atlantoaxial articulation is normal. Minimal bilateral neural foraminal narrowing at several levels. No acute fracture. Soft tissues and spinal canal: No significant spinal canal stenosis. Prevertebral soft tissues are normal. Disc levels: Moderate disc space narrowing at the C5-6 and C6-7 levels. Upper chest: No acute findings. Other: None. IMPRESSION: 1.  No acute brain injury. 2. Stable chronic encephalomalacia over the right frontal lobe suggestion of previous right frontal craniotomy. Old left occipital infarct. Mild chronic ischemic microvascular disease. 3.  No acute cervical spine injury. 4. Mild spondylosis throughout the cervical spine with disc disease at the C5-6 and C6-7 levels. Mild bilateral neural foraminal narrowing at multiple levels. 5. 1.6 cm well-defined lytic focus over the body the right mandible  likely odontogenic cyst. Electronically Signed   By: Marin Olp M.D.   On: 07/27/2019 14:57   CT Cervical Spine Wo Contrast  Result Date: 07/27/2019 CLINICAL DATA:  Minor head trauma. EXAM: CT HEAD WITHOUT CONTRAST CT CERVICAL SPINE WITHOUT CONTRAST TECHNIQUE: Multidetector CT imaging of  the head and cervical spine was performed following the standard protocol without intravenous contrast. Multiplanar CT image reconstructions of the cervical spine were also generated. COMPARISON:  Head CT 09/12/2008 FINDINGS: CT HEAD FINDINGS Brain: Ventricles and cisterns are within normal. There is chronic ischemic microvascular disease. Moderate region of encephalomalacia over the right frontal lobe unchanged. 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 No evidence of mass, mass effect or shift of midline structures. No acute hemorrhage. No acute infarction. Vascular: No hyperdense vessel or unexpected calcification. Skull: Stable changes over the frontal skull likely from previous craniotomy. Sinuses/Orbits: Orbits are unremarkable. Chronic opacification over the right ethmoid air cells and mild mucosal membrane thickening over the floor the maxillary sinuses. Mastoid air cells are clear. Other: 1.6 cm well-defined oval lytic process over the body of the right mandible. CT CERVICAL SPINE FINDINGS Alignment: No evidence of acute traumatic subluxation. Subtle degenerative anterior subluxation of C4 on C5. Skull base and vertebrae: Vertebral body heights are maintained. There is mild spondylosis throughout the cervical spine to include facet arthropathy and uncovertebral joint spurring. Atlantoaxial articulation is normal. Minimal bilateral neural foraminal narrowing at several levels. No acute fracture. Soft tissues and spinal canal: No significant spinal canal stenosis. Prevertebral soft tissues are normal. Disc levels: Moderate disc space narrowing at the C5-6 and C6-7 levels. Upper chest: No acute findings. Other: None. IMPRESSION: 1.  No acute brain injury. 2. Stable chronic encephalomalacia over the right frontal lobe suggestion of previous right frontal craniotomy. Old left occipital infarct. Mild chronic ischemic microvascular disease. 3.  No acute cervical spine injury. 4. Mild spondylosis throughout the cervical  spine with disc disease at the C5-6 and C6-7 levels. Mild bilateral neural foraminal narrowing at multiple levels. 5. 1.6 cm well-defined lytic focus over the body the right mandible likely odontogenic cyst. Electronically Signed   By: Marin Olp M.D.   On: 07/27/2019 14:57   DG Chest Portable 1 View  Result Date: 07/27/2019 CLINICAL DATA:  Syncopal episode. EXAM: PORTABLE CHEST 1 VIEW COMPARISON:  01/09/2017 FINDINGS: Lungs are hypoinflated without lobar consolidation, effusion or pneumothorax. Cardiomediastinal silhouette and remainder of the exam is unchanged. IMPRESSION: Hypoinflation without acute cardiopulmonary disease. Electronically Signed   By: Marin Olp M.D.   On: 07/27/2019 11:50   DG Knee Left Port  Result Date: 07/27/2019 CLINICAL DATA:  84 year old female with fall and trauma to the left knee. EXAM: PORTABLE LEFT KNEE - 1-2 VIEW COMPARISON:  None. FINDINGS: There is no acute fracture or dislocation. The bones are osteopenic. There is severe osteoarthritic changes with tricompartmental narrowing most prominent involving the lateral compartment. There is bone spurring. No significant joint effusion. The soft tissues are unremarkable. IMPRESSION: 1. No acute fracture or dislocation. 2. Severe osteoarthritis. Electronically Signed   By: Anner Crete M.D.   On: 07/27/2019 15:19    Procedures Procedures (including critical care time)  Medications Ordered in ED Medications  LORazepam (ATIVAN) injection 0.5 mg ( Intravenous Not Given 07/27/19 1447)    ED Course  I have reviewed the triage vital signs and the nursing notes.  Pertinent labs & imaging results that were available during my care of the patient were reviewed by me and considered in my  medical decision making (see chart for details).  Clinical Course as of Jul 26 1522  Sat Jul 27, 2019  [place]   [BM]  1215 No answer Stephan Minister   [BM]  1301 [place] will give my number to staff and call back at [phone]    [BM]  1305 [place] RN;    [BM]  1305 8:15   [BM]    Clinical Course User Index [BM] Gari Crown   MDM Rules/Calculators/A&P                      84 year old female history of dementia, no listed blood thinners on chart review today presents after a fall at her nursing home out of a chair.  It is reported that she slid out of her chair and had no injuries.  On arrival she is well-appearing in no acute distress.  Will obtain CT head/cervical spine.  Basic blood work, EKG, chest x-ray and troponin along with a CBG. - 10:50 AM: I spoke with patient's power of attorney, [name] on the phone who reports that she did talk to the nursing staff earlier who reports that patient felt dizzy and slid out of her wheelchair.  She advises for me to tell the patient that it is okay for her to get the CT scan.  I was in the CT room with the patient and her power of attorney on the phone and attempted to negotiate with the patient to obtain CT scan.  Patient became very agitated threatening to hit and insulting her power of attorney.  She was returned to her room, stable no acute distress.  She has no complaints.  Discussed with Dr. Eulis Foster, no indication to sedate patient at this time. - I reevaluated the patient, she is resting comfortably in bed no acute distress.  I had a long discussion with the patient and she now consents to CT scan for evaluation of intracranial injury.  She was again taken over to the CT scanner, while there the radiology technician informed me that she was again refusing scan.  We attempted for several minutes to convince patient to lay flat for CT scan unsuccessfully.  Patient was again returned to her room. - 1:05 PM: I received a call from [facility] and spoke to patient's nurse cannula.  Can you reports that she witnessed the event, she reports that patient was sitting in her chair talking with another resident when she fell forward out of her chair, RN was in room at the  time.  She reports patient fell face 1st out of her chair and did not lose consciousness.  Patient reported that she felt dizzy at that time.  She was picked up by EMS, no visible injuries.  She reports that patient is normally a pretty cooperative with them at the nursing home occasionally has behavioral problems, has been acting normally yesterday and this morning prior to fall.  No blood thinner use per their record. - Patient continued to be agitated with nursing staff, she was given 0.5 mg of Ativan, she was then calm and cooperative, remained alert and was agreeable to imaging today.  She was monitored throughout visit without alteration of mental status.  Additionally after several hours in the ED RN reports that patient had complained of left knee pain and x-ray was added.  On reassessment patient's knees appear symmetric, she had no tenderness to palpation on exam, NVI.  She denies left knee pain on reevaluation.  Moves bilateral lower extremities in all major joints without evidence of pain. - CBC within normal limits no leukocytosis to suggest infection, no signs of anemia High-sensitivity troponin within normal limits x2, EKG without acute ischemic changes reviewed with Dr. Eulis Foster, doubt ACS as etiology of her symptoms today BMP without acute electrolyte abnormalities or evidence of kidney injury CBG 101, no evidence of hypoglycemia  CT Head/Cspine:  IMPRESSION:  1. No acute brain injury.    2. Stable chronic encephalomalacia over the right frontal lobe  suggestion of previous right frontal craniotomy. Old left occipital  infarct. Mild chronic ischemic microvascular disease.    3. No acute cervical spine injury.    4. Mild spondylosis throughout the cervical spine with disc disease  at the C5-6 and C6-7 levels. Mild bilateral neural foraminal  narrowing at multiple levels.    5. 1.6 cm well-defined lytic focus over the body the right mandible  likely odontogenic cyst.   DG  Chest:    IMPRESSION:  Hypoinflation without acute cardiopulmonary disease.   DG Pelvis:  IMPRESSION:  No acute findings.   DG Left Knee: IMPRESSION:  1. No acute fracture or dislocation.  2. Severe osteoarthritis.  Possible odontogenic cyst was noted on CT scan, intraoral exam shows no evidence of a dental abscess, PTA, RPA or other emergent pathologies requiring antibiotics or further work-up at this time.  This will be noted on her discharge paperwork and follow-up with PCP as advised.  Patient reassessed resting comfortably no acute distress vital signs stable.  She is well-appearing, pleasant and cooperative at this time.  She reports that she is feeling well, denies any pain and has no complaints.  She is requesting to go home at this time.  Imaging as above reassuring, no evidence of acute injury today, there is no indication for further work-up at this time.  She is being discharged back to her nursing facility who can monitor him to avoid future falls.  She has had no hypotension here in the ED, do not feel any intervention is needed at this time.  Patient seen and evaluated by Dr. Eulis Foster during this visit, doubt acute cardiopulmonary etiology of her fall or other emergent pathologies at this time, she appears stable for discharge back to facility. - 3:24 PM: I attempted to update patient's daughter/power of attorney [name] via phone, no answer. - At this time there does not appear to be any evidence of an acute emergency medical condition and the patient appears stable for discharge with appropriate outpatient follow up. Diagnosis was discussed with patient who verbalizes understanding of care plan and is agreeable to discharge. I have discussed return precautions with patient who verbalizes understanding of return precautions. Patient encouraged to follow-up with their PCP. All questions answered.  Patient's case discussed with Dr. Eulis Foster who agrees with plan to discharge with follow-up.    Addendum: 3:40 PM: I was able to contact patient's power of attorney [name], updated her on work-up here today, she has no questions, agreeable to discharge back to facility.  Note: Portions of this report may have been transcribed using voice recognition software. Every effort was made to ensure accuracy; however, inadvertent computerized transcription errors may still be present. Final Clinical Impression(s) / ED Diagnoses Final diagnoses:  Fall, initial encounter  Injury of head, initial encounter    Rx / DC Orders ED Discharge Orders    None       Gari Crown 07/27/19 1539    Daleen Bo,  MD 07/27/19 1541    Deliah Boston, PA-C 07/27/19 1543    Daleen Bo, MD 07/30/19 [phone]

## 2019-07-27 NOTE — ED Notes (Signed)
Pt transported to imaging.

## 2019-07-27 NOTE — Discharge Instructions (Addendum)
You have been diagnosed today with Fall, head injury.  At this time there does not appear to be the presence of an emergent medical condition, however there is always the potential for conditions to change. Please read and follow the below instructions.  Please return to the Emergency Department immediately for any new or worsening symptoms. Please be sure to follow up with your Primary Care Provider within one week regarding your visit today; please call their office to schedule an appointment even if you are feeling better for a follow-up visit. The patient's CT scan showed multiple incidental findings including spondylosis of the cervical spine with disc disease, mild bilateral neural foraminal narrowing at multiple levels.  Additionally a 1.6 cm lytic focus was seen over the right mandible which may be an odontogenic cyst.  Additionally there was severe arthritis of the left knee seen on x-ray today.  Please discuss these incidental findings with the primary care provider at follow-up visit this week.  You may review the full results on the patient's MyChart account, discuss them in their entirety with the primary care provider at your next visit.  Get help right away if: You have: A very bad headache that is not helped by medicine. Trouble walking or weakness in your arms and legs. Clear or bloody fluid coming from your nose or ears. Changes in how you see (vision). Shaking movements that you cannot control. You lose your balance. You vomit. The black centers of your eyes (pupils) change in size. Your speech is slurred. Your dizziness gets worse. You pass out. You are sleepier than normal and have trouble staying awake. You have any new/concerning or worsening of symptoms  Please read the additional information packets attached to your discharge summary.  Do not take your medicine if  develop an itchy rash, swelling in your mouth or lips, or difficulty breathing; call 911 and seek  immediate emergency medical attention if this occurs.  Note: Portions of this text may have been transcribed using voice recognition software. Every effort was made to ensure accuracy; however, inadvertent computerized transcription errors may still be present.

## 2019-07-27 NOTE — ED Provider Notes (Signed)
  Face-to-face evaluation   History: Patient being evaluated for fall from wheelchair, unprovoked.  She reportedly had a syncopal episode.  Physical exam: Alert overweight elderly female.  No dysarthria or aphasia.  She has fair range of motion of the neck, lateral rotation bilaterally, without pain.  No dysarthria or aphasia.  Patient is reluctant to participate in physical examination.  She asked that I not listen to her heart and lungs.  Medical screening examination/treatment/procedure(s) were conducted as a shared visit with non-physician practitioner(s) and myself.  I personally evaluated the patient during the encounter    Daleen Bo, MD 07/27/19 [phone]

## 2019-07-30 DIAGNOSIS — R451 Restlessness and agitation: Secondary | ICD-10-CM | POA: Diagnosis not present

## 2019-07-30 DIAGNOSIS — G301 Alzheimer's disease with late onset: Secondary | ICD-10-CM | POA: Diagnosis not present

## 2019-07-30 DIAGNOSIS — S0093XA Contusion of unspecified part of head, initial encounter: Secondary | ICD-10-CM | POA: Diagnosis not present

## 2019-07-30 DIAGNOSIS — R296 Repeated falls: Secondary | ICD-10-CM | POA: Diagnosis not present

## 2019-07-31 DIAGNOSIS — J45909 Unspecified asthma, uncomplicated: Secondary | ICD-10-CM | POA: Diagnosis not present

## 2019-08-19 ENCOUNTER — Other Ambulatory Visit: Payer: Self-pay | Admitting: *Deleted

## 2019-08-19 ENCOUNTER — Other Ambulatory Visit: Payer: Self-pay | Admitting: Neurology

## 2019-08-22 ENCOUNTER — Ambulatory Visit: Payer: Self-pay | Admitting: Neurology
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
## 2019-08-27 ENCOUNTER — Encounter: Payer: Self-pay | Admitting: Neurology

## 2019-08-28 DIAGNOSIS — J45909 Unspecified asthma, uncomplicated: Secondary | ICD-10-CM | POA: Diagnosis not present

## 2019-09-28 DIAGNOSIS — J45909 Unspecified asthma, uncomplicated: Secondary | ICD-10-CM | POA: Diagnosis not present
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
## 2019-10-09 ENCOUNTER — Ambulatory Visit: Payer: Self-pay | Admitting: Neurology

## 2019-10-28 DIAGNOSIS — J45909 Unspecified asthma, uncomplicated: Secondary | ICD-10-CM | POA: Diagnosis not present
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
## 2019-11-07 ENCOUNTER — Encounter: Payer: Self-pay | Admitting: Neurology

## 2019-11-07 ENCOUNTER — Ambulatory Visit (INDEPENDENT_AMBULATORY_CARE_PROVIDER_SITE_OTHER): Payer: Medicare Other | Admitting: Neurology

## 2019-11-07 ENCOUNTER — Other Ambulatory Visit: Payer: Self-pay

## 2019-11-07 VITALS — BP 153/74 | HR 83 | Ht 69.0 in | Wt 185.0 lb

## 2019-11-07 DIAGNOSIS — R410 Disorientation, unspecified: Secondary | ICD-10-CM | POA: Diagnosis not present

## 2019-11-07 DIAGNOSIS — R413 Other amnesia: Secondary | ICD-10-CM

## 2019-11-07 MED ORDER — MEMANTINE HCL 5 MG PO TABS: ORAL_TABLET | ORAL | 0 refills | Status: DC

## 2019-11-07 MED ORDER — MEMANTINE HCL 10 MG PO TABS: 10.0000 mg | ORAL_TABLET | Freq: Two times a day (BID) | ORAL | 11 refills | Status: AC

## 2019-11-07 NOTE — Patient Instructions (Signed)
Start namenda for [employer] titration pack first for 1 month, then start the maintenance dose 10 mg twice daily I have printed both rx Memory score was 15/30 today Continue other medications Order EEG  Pay attention to possible staring spells See back in 6 months

## 2019-11-28 DIAGNOSIS — J45909 Unspecified asthma, uncomplicated: Secondary | ICD-10-CM | POA: Diagnosis not present

## 2019-12-28 DIAGNOSIS — J45909 Unspecified asthma, uncomplicated: Secondary | ICD-10-CM | POA: Diagnosis not present

## 2020-01-13 DIAGNOSIS — G301 Alzheimer's disease with late onset: Secondary | ICD-10-CM | POA: Diagnosis not present

## 2020-01-13 DIAGNOSIS — I10 Essential (primary) hypertension: Secondary | ICD-10-CM | POA: Diagnosis not present

## 2020-01-22 DIAGNOSIS — E039 Hypothyroidism, unspecified: Secondary | ICD-10-CM | POA: Diagnosis not present

## 2020-01-22 DIAGNOSIS — D649 Anemia, unspecified: Secondary | ICD-10-CM | POA: Diagnosis not present

## 2020-01-22 DIAGNOSIS — E119 Type 2 diabetes mellitus without complications: Secondary | ICD-10-CM | POA: Diagnosis not present

## 2020-01-22 DIAGNOSIS — I10 Essential (primary) hypertension: Secondary | ICD-10-CM | POA: Diagnosis not present

## 2020-01-28 DIAGNOSIS — J45909 Unspecified asthma, uncomplicated: Secondary | ICD-10-CM | POA: Diagnosis not present

## 2020-02-28 DIAGNOSIS — J45909 Unspecified asthma, uncomplicated: Secondary | ICD-10-CM | POA: Diagnosis not present

## 2020-03-09 DIAGNOSIS — Z23 Encounter for immunization: Secondary | ICD-10-CM | POA: Diagnosis not present

## 2020-03-29 DIAGNOSIS — J45909 Unspecified asthma, uncomplicated: Secondary | ICD-10-CM | POA: Diagnosis not present

## 2020-04-29 DIAGNOSIS — J45909 Unspecified asthma, uncomplicated: Secondary | ICD-10-CM | POA: Diagnosis not present

## 2020-05-06 ENCOUNTER — Ambulatory Visit: Payer: Medicare Other | Admitting: Neurology

## 2020-05-09 ENCOUNTER — Inpatient Hospital Stay (HOSPITAL_COMMUNITY)
Admission: EM | Admit: 2020-05-09 | Discharge: 2020-05-15 | DRG: 543 | Disposition: A | Discharge status: Nursing Home (Includes ICF) | Payer: Medicare Other | Attending: Internal Medicine | Admitting: Internal Medicine

## 2020-05-09 ENCOUNTER — Emergency Department (HOSPITAL_COMMUNITY): Payer: Medicare Other

## 2020-05-09 ENCOUNTER — Other Ambulatory Visit: Payer: Self-pay

## 2020-05-09 ENCOUNTER — Encounter (HOSPITAL_COMMUNITY): Payer: Self-pay | Admitting: Emergency Medicine

## 2020-05-09 DIAGNOSIS — R8271 Bacteriuria: Secondary | ICD-10-CM | POA: Diagnosis not present

## 2020-05-09 DIAGNOSIS — M4854XA Collapsed vertebra, not elsewhere classified, thoracic region, initial encounter for fracture: Secondary | ICD-10-CM | POA: Diagnosis not present

## 2020-05-09 DIAGNOSIS — E870 Hyperosmolality and hypernatremia: Secondary | ICD-10-CM | POA: Diagnosis not present

## 2020-05-09 DIAGNOSIS — J449 Chronic obstructive pulmonary disease, unspecified: Secondary | ICD-10-CM | POA: Diagnosis present

## 2020-05-09 DIAGNOSIS — F0280 Dementia in other diseases classified elsewhere without behavioral disturbance: Secondary | ICD-10-CM | POA: Diagnosis present

## 2020-05-09 DIAGNOSIS — Z86711 Personal history of pulmonary embolism: Secondary | ICD-10-CM

## 2020-05-09 DIAGNOSIS — J9611 Chronic respiratory failure with hypoxia: Secondary | ICD-10-CM | POA: Diagnosis not present

## 2020-05-09 DIAGNOSIS — S32000A Wedge compression fracture of unspecified lumbar vertebra, initial encounter for closed fracture: Secondary | ICD-10-CM | POA: Diagnosis present

## 2020-05-09 DIAGNOSIS — W19XXXA Unspecified fall, initial encounter: Secondary | ICD-10-CM | POA: Diagnosis present

## 2020-05-09 DIAGNOSIS — Z823 Family history of stroke: Secondary | ICD-10-CM | POA: Diagnosis not present

## 2020-05-09 DIAGNOSIS — N3000 Acute cystitis without hematuria: Secondary | ICD-10-CM | POA: Diagnosis not present

## 2020-05-09 DIAGNOSIS — Z79899 Other long term (current) drug therapy: Secondary | ICD-10-CM

## 2020-05-09 DIAGNOSIS — N179 Acute kidney failure, unspecified: Secondary | ICD-10-CM | POA: Diagnosis present

## 2020-05-09 DIAGNOSIS — Z20822 Contact with and (suspected) exposure to covid-19: Secondary | ICD-10-CM | POA: Diagnosis present

## 2020-05-09 DIAGNOSIS — R52 Pain, unspecified: Secondary | ICD-10-CM | POA: Diagnosis not present

## 2020-05-09 DIAGNOSIS — R918 Other nonspecific abnormal finding of lung field: Secondary | ICD-10-CM | POA: Diagnosis not present

## 2020-05-09 DIAGNOSIS — M549 Dorsalgia, unspecified: Secondary | ICD-10-CM | POA: Diagnosis not present

## 2020-05-09 DIAGNOSIS — S32010A Wedge compression fracture of first lumbar vertebra, initial encounter for closed fracture: Secondary | ICD-10-CM | POA: Diagnosis not present

## 2020-05-09 DIAGNOSIS — R413 Other amnesia: Secondary | ICD-10-CM | POA: Diagnosis not present

## 2020-05-09 DIAGNOSIS — R609 Edema, unspecified: Secondary | ICD-10-CM | POA: Diagnosis not present

## 2020-05-09 DIAGNOSIS — Z800 Family history of malignant neoplasm of digestive organs: Secondary | ICD-10-CM | POA: Diagnosis not present

## 2020-05-09 DIAGNOSIS — S22000A Wedge compression fracture of unspecified thoracic vertebra, initial encounter for closed fracture: Secondary | ICD-10-CM | POA: Diagnosis not present

## 2020-05-09 DIAGNOSIS — Z7722 Contact with and (suspected) exposure to environmental tobacco smoke (acute) (chronic): Secondary | ICD-10-CM | POA: Diagnosis not present

## 2020-05-09 DIAGNOSIS — E785 Hyperlipidemia, unspecified: Secondary | ICD-10-CM | POA: Diagnosis not present

## 2020-05-09 DIAGNOSIS — G309 Alzheimer's disease, unspecified: Secondary | ICD-10-CM | POA: Diagnosis not present

## 2020-05-09 DIAGNOSIS — K8020 Calculus of gallbladder without cholecystitis without obstruction: Secondary | ICD-10-CM | POA: Diagnosis not present

## 2020-05-09 DIAGNOSIS — Y92099 Unspecified place in other non-institutional residence as the place of occurrence of the external cause: Secondary | ICD-10-CM

## 2020-05-09 DIAGNOSIS — Z888 Allergy status to other drugs, medicaments and biological substances status: Secondary | ICD-10-CM

## 2020-05-09 DIAGNOSIS — Z8249 Family history of ischemic heart disease and other diseases of the circulatory system: Secondary | ICD-10-CM | POA: Diagnosis not present

## 2020-05-09 DIAGNOSIS — Z043 Encounter for examination and observation following other accident: Secondary | ICD-10-CM | POA: Diagnosis not present

## 2020-05-09 DIAGNOSIS — R443 Hallucinations, unspecified: Secondary | ICD-10-CM | POA: Diagnosis present

## 2020-05-09 DIAGNOSIS — I444 Left anterior fascicular block: Secondary | ICD-10-CM | POA: Diagnosis not present

## 2020-05-09 DIAGNOSIS — N390 Urinary tract infection, site not specified: Secondary | ICD-10-CM | POA: Diagnosis present

## 2020-05-09 DIAGNOSIS — R0902 Hypoxemia: Secondary | ICD-10-CM

## 2020-05-09 DIAGNOSIS — M19019 Primary osteoarthritis, unspecified shoulder: Secondary | ICD-10-CM | POA: Diagnosis not present

## 2020-05-09 DIAGNOSIS — B9620 Unspecified Escherichia coli [E. coli] as the cause of diseases classified elsewhere: Secondary | ICD-10-CM | POA: Diagnosis present

## 2020-05-09 DIAGNOSIS — R54 Age-related physical debility: Secondary | ICD-10-CM | POA: Diagnosis present

## 2020-05-09 DIAGNOSIS — M545 Low back pain, unspecified: Secondary | ICD-10-CM | POA: Diagnosis not present

## 2020-05-09 DIAGNOSIS — R531 Weakness: Secondary | ICD-10-CM

## 2020-05-09 DIAGNOSIS — M4856XA Collapsed vertebra, not elsewhere classified, lumbar region, initial encounter for fracture: Secondary | ICD-10-CM | POA: Diagnosis not present

## 2020-05-09 DIAGNOSIS — I10 Essential (primary) hypertension: Secondary | ICD-10-CM | POA: Diagnosis not present

## 2020-05-09 DIAGNOSIS — Z87820 Personal history of traumatic brain injury: Secondary | ICD-10-CM | POA: Diagnosis not present

## 2020-05-09 DIAGNOSIS — M2550 Pain in unspecified joint: Secondary | ICD-10-CM | POA: Diagnosis not present

## 2020-05-09 DIAGNOSIS — Z743 Need for continuous supervision: Secondary | ICD-10-CM | POA: Diagnosis not present

## 2020-05-09 DIAGNOSIS — Z7401 Bed confinement status: Secondary | ICD-10-CM | POA: Diagnosis not present

## 2020-05-09 DIAGNOSIS — Z9981 Dependence on supplemental oxygen: Secondary | ICD-10-CM

## 2020-05-09 DIAGNOSIS — S22080A Wedge compression fracture of T11-T12 vertebra, initial encounter for closed fracture: Secondary | ICD-10-CM | POA: Diagnosis not present

## 2020-05-09 DIAGNOSIS — R7989 Other specified abnormal findings of blood chemistry: Secondary | ICD-10-CM | POA: Diagnosis not present

## 2020-05-09 DIAGNOSIS — R6889 Other general symptoms and signs: Secondary | ICD-10-CM | POA: Diagnosis not present

## 2020-05-09 LAB — URINALYSIS, ROUTINE W REFLEX MICROSCOPIC
Bilirubin Urine: NEGATIVE
Glucose, UA: NEGATIVE mg/dL
Ketones, ur: NEGATIVE mg/dL
Nitrite: NEGATIVE
Protein, ur: 30 mg/dL — AB
Specific Gravity, Urine: 1.017 (ref 1.005–1.030)
WBC, UA: >50 WBC/hpf — ABNORMAL HIGH (ref 0–5)
pH: 7 (ref 5.0–8.0)

## 2020-05-09 LAB — CBC WITH DIFFERENTIAL/PLATELET
Abs Immature Granulocytes: 0.03 10*3/uL (ref 0.00–0.07)
Basophils Absolute: 0.1 10*3/uL (ref 0.0–0.1)
Basophils Relative: 1 %
Eosinophils Absolute: 0.4 10*3/uL (ref 0.0–0.5)
Eosinophils Relative: 4 %
HCT: 44.2 % (ref 36.0–46.0)
Hemoglobin: 14.2 g/dL (ref 12.0–15.0)
Immature Granulocytes: 0 %
Lymphocytes Relative: 20 %
Lymphs Abs: 2 10*3/uL (ref 0.7–4.0)
MCH: 30.5 pg (ref 26.0–34.0)
MCHC: 32.1 g/dL (ref 30.0–36.0)
MCV: 94.8 fL (ref 80.0–100.0)
Monocytes Absolute: 1.1 10*3/uL — ABNORMAL HIGH (ref 0.1–1.0)
Monocytes Relative: 11 %
Neutro Abs: 6.5 10*3/uL (ref 1.7–7.7)
Neutrophils Relative %: 64 %
Platelets: 250 10*3/uL (ref 150–400)
RBC: 4.66 MIL/uL (ref 3.87–5.11)
RDW: 13 % (ref 11.5–15.5)
WBC: 10 10*3/uL (ref 4.0–10.5)
nRBC: 0 % (ref 0.0–0.2)

## 2020-05-09 LAB — COMPREHENSIVE METABOLIC PANEL
ALT: 19 U/L (ref 0–44)
AST: 21 U/L (ref 15–41)
Albumin: 4 g/dL (ref 3.5–5.0)
Alkaline Phosphatase: 92 U/L (ref 38–126)
Anion gap: 13 (ref 5–15)
BUN: 62 mg/dL — ABNORMAL HIGH (ref 8–23)
CO2: 27 mmol/L (ref 22–32)
Calcium: 10.5 mg/dL — ABNORMAL HIGH (ref 8.9–10.3)
Chloride: 108 mmol/L (ref 98–111)
Creatinine, Ser: 1.05 mg/dL — ABNORMAL HIGH (ref 0.44–1.00)
GFR, Estimated: 53 mL/min — ABNORMAL LOW (ref >60)
Glucose, Bld: 131 mg/dL — ABNORMAL HIGH (ref 70–99)
Potassium: 4 mmol/L (ref 3.5–5.1)
Sodium: 148 mmol/L — ABNORMAL HIGH (ref 135–145)
Total Bilirubin: 1.2 mg/dL (ref 0.3–1.2)
Total Protein: 8.4 g/dL — ABNORMAL HIGH (ref 6.5–8.1)

## 2020-05-09 LAB — RESP PANEL BY RT-PCR (FLU A&B, COVID) ARPGX2
Influenza A by PCR: NEGATIVE
Influenza B by PCR: NEGATIVE
SARS Coronavirus 2 by RT PCR: NEGATIVE

## 2020-05-09 LAB — D-DIMER, QUANTITATIVE: D-Dimer, Quant: >20 ug/mL-FEU — ABNORMAL HIGH (ref 0.00–0.50)

## 2020-05-09 MED ORDER — LORAZEPAM 2 MG/ML IJ SOLN
0.5000 mg | Freq: Once | INTRAMUSCULAR | Status: AC
  Administered 2020-05-09: 0.5 mg via INTRAVENOUS
  Filled 2020-05-09: qty 1

## 2020-05-09 MED ORDER — METHYLPREDNISOLONE SODIUM SUCC 125 MG IJ SOLR
80.0000 mg | Freq: Once | INTRAMUSCULAR | Status: AC
  Administered 2020-05-09: 80 mg via INTRAVENOUS
  Filled 2020-05-09: qty 2

## 2020-05-09 MED ORDER — SODIUM CHLORIDE 0.9 % IV SOLN
1.0000 g | Freq: Once | INTRAVENOUS | Status: AC
  Administered 2020-05-10: 1 g via INTRAVENOUS
  Filled 2020-05-09: qty 10

## 2020-05-09 NOTE — ED Notes (Signed)
Tried to get a urine sample from patient. She said she is not able to go at this time. I will attempt to collect at a later time.

## 2020-05-09 NOTE — ED Provider Notes (Signed)
[facility] DEPT Provider Note   CSN: [id number] Arrival date & time: 05/09/20  1701     History Chief Complaint  Patient presents with  . Back Pain  . Fatigue    [name] is a 84 y.o. female with PMH/o Asthma, COPD, HLD, HTN who presents for evaluation of pain, malaise.  Patient is very poor historian.  She has history of dementia.  I talked with daughter who states that she will frequently get confused and have hallucinations.  Daughter states that she had noted when she visited a few days ago that patient had been staying in her room more because she did not feel well but nursing home did not clarify specifically what was going on.  Daughter does not know of any reported falls.  Daughter does report the patient has been on home O2 as needed.  Patient has not been vaccinated for Covid.  EM LEVEL 5 CAVEAT DUE TO DEMENTIA  The history is provided by the patient.       Past Medical History:  Diagnosis Date  . Arthritis    "legs" (01/09/2017)  . Asthma with COPD (chronic obstructive pulmonary disease) ([place])   . Chronic bronchitis ([place])   . COPD (chronic obstructive pulmonary disease) ([place])   . Hyperlipidemia   . Hypertension   . Memory loss   . Multiple environmental allergies   . Obesity (BMI 35.0-39.9 without comorbidity)   . On home oxygen therapy    "1.5L usually when she sleeps" (01/09/2017)  . Osteoarthritis of shoulder   . Pneumonia    "a couple times" (8//11/2016)  . Pulmonary embolism ([place]) ?2005  . Tachycardia     Patient Active Problem List   Diagnosis Date Noted  . Memory loss 07/30/2018  . Gait abnormality 07/30/2018  . Confusion 07/30/2018  . Acute on chronic respiratory failure with hypoxia ([place]) 01/09/2017  . COPD exacerbation ([place])   . DOE (dyspnea on exertion) 09/01/2011    Class: Hospitalized for  . Hypertension   . Hyperlipidemia     Class: Diagnosis of  . Pulmonary embolism (HCC)     Class: History of  .  Obesity (BMI 35.0-39.9 without comorbidity)     Class: Chronic  . Asthma with COPD (chronic obstructive pulmonary disease) (HCC)     Class: Diagnosis of  . Abnormal nuclear stress test 08/30/2011    Class: Hospitalized for    Past Surgical History:  Procedure Laterality Date  . ABDOMINAL HYSTERECTOMY    . CARDIAC CATHETERIZATION    . CATARACT EXTRACTION, BILATERAL Bilateral   . DILATION AND CURETTAGE OF UTERUS    . LEFT HEART CATHETERIZATION WITH CORONARY ANGIOGRAM N/A 09/01/2011   Procedure: LEFT HEART CATHETERIZATION WITH CORONARY ANGIOGRAM;  Surgeon: Leonie Man, MD;  Location: [facility] CATH LAB;  Service: Cardiovascular;  Laterality: N/A;  . TONSILLECTOMY       OB History   No obstetric history on file.     Family History  Problem Relation Age of Onset  . Cancer Mother        pancreatic  . CAD Father   . Heart failure Brother        congenital heart disease  . Stroke Brother     Social History   Tobacco Use  . Smoking status: Never Smoker  . Smokeless tobacco: Never Used  . Tobacco comment: Long term second hand smoke exposure  Vaping Use  . Vaping Use: Never used  Substance Use Topics  .  Alcohol use: No  . Drug use: No    Home Medications Prior to Admission medications   Medication Sig Start Date End Date Taking? Authorizing Provider  acetaminophen (TYLENOL) 500 MG tablet Take 500 mg by mouth every 6 (six) hours as needed for mild pain.    [provider]  albuterol (PROVENTIL HFA;VENTOLIN HFA) 108 (90 Base) MCG/ACT inhaler Inhale 2 puffs into the lungs every 6 (six) hours as needed for wheezing or shortness of breath. 01/13/17   Florencia Reasons, MD  albuterol (PROVENTIL) (2.5 MG/3ML) 0.083% nebulizer solution Take 2.5 mg by nebulization 2 (two) times daily.    [provider]  alum & mag hydroxide-simeth (MAALOX/MYLANTA) 200-200-20 MG/5ML suspension Take 30 mLs by mouth every 6 (six) hours as needed for indigestion or heartburn.    [provider]  BYSTOLIC 10 MG tablet TAKE 1 TABLET BY MOUTH DAILY** NEED APPOINTMENT** Patient taking differently: Take 10 mg by mouth daily.  04/30/13   Leonie Man, MD  guaiFENesin (MUCINEX) 600 MG 12 hr tablet Take 1 tablet (600 mg total) by mouth 2 (two) times daily. 01/13/17   Florencia Reasons, MD  guaifenesin (ROBITUSSIN) 100 MG/5ML syrup Take 200 mg by mouth every 6 (six) hours as needed for cough.    [provider]  lamoTRIgine (LAMICTAL) 100 MG tablet Take 1 tablet (100 mg total) by mouth 2 (two) times daily. Please call [phone] to schedule follow up. 05/21/19   Marcial Pacas, MD  loperamide (IMODIUM) 2 MG capsule Take 2 mg by mouth as needed for diarrhea or loose stools.    [provider]  magnesium hydroxide (MILK OF MAGNESIA) 400 MG/5ML suspension Take 30 mLs by mouth at bedtime as needed for mild constipation.    [provider]  memantine (NAMENDA) 10 MG tablet Take 1 tablet (10 mg total) by mouth 2 (two) times daily. 11/07/19   Suzzanne Cloud, NP  memantine [facility]) 5 MG tablet Take 1 tablet daily for one week, then take 1 tablet twice daily for one week, then take 1 tablet in the morning and 2 in the evening for one week, then take 2 tablets twice daily 11/07/19   Suzzanne Cloud, NP  Multiple Vitamin (MULTIVITAMIN) tablet Take 1 tablet by mouth daily.    [provider]  neomycin-bacitracin-polymyxin (NEOSPORIN) [phone] ointment Apply 1 application topically every 6 (six) hours as needed (abrasions).    [provider]  OXYGEN Inhale 2 L/min into the lungs as needed.    [provider]  predniSONE (DELTASONE) 10 MG tablet Label  & dispense according to the schedule below. 5 Pills PO on day one then, 4 Pills PO on day two, 3 Pills PO on day three, 2Pills PO on day four, 1 Pills PO on day five, then STOP. 01/13/17   Florencia Reasons, MD  QUEtiapine (SEROQUEL) 25 MG tablet Take 1 tablet (25 mg total) by mouth at bedtime. Please call [phone] to  schedule an appt. 01/16/19   Marcial Pacas, MD  senna-docusate (SENOKOT-S) 8.6-50 MG tablet Take 1 tablet by mouth at bedtime. 01/13/17   Florencia Reasons, MD  Vitamin D, Cholecalciferol, 25 MCG (1000 UT) TABS Take 50 mcg by mouth daily.    [provider]    Allergies    Warfarin sodium  Review of Systems   Review of Systems  Unable to perform ROS: Dementia    Physical Exam Updated Vital Signs BP (!) 106/56 (BP Location: Right Arm)   Pulse  69   Temp 97.8 F (36.6 C) (Oral)   Resp 17   SpO2 92%   Physical Exam Vitals and nursing note reviewed.  Constitutional:      Appearance: Normal appearance. She is well-developed.     Comments: Frail and elderly appearing  HENT:     Head: Normocephalic and atraumatic.  Eyes:     General: Lids are normal.     Conjunctiva/sclera: Conjunctivae normal.     Pupils: Pupils are equal, round, and reactive to light.     Comments: PERRL. EOMs intact. No nystagmus. No neglect.   Cardiovascular:     Rate and Rhythm: Normal rate and regular rhythm.     Pulses: Normal pulses.     Heart sounds: Normal heart sounds. No murmur heard.  No friction rub. No gallop.   Pulmonary:     Effort: Pulmonary effort is normal.     Breath sounds: Normal breath sounds.     Comments: Poor effort. No obvious wheezing Abdominal:     Palpations: Abdomen is soft. Abdomen is not rigid.     Tenderness: There is generalized abdominal tenderness. There is no guarding.     Comments: Abdomen is soft, nondistended.  Diffuse tenderness noted with no focal point.  No rigidity, guarding.  There is some distractibility component noted to the tenderness.  Musculoskeletal:        General: Normal range of motion.     Cervical back: Full passive range of motion without pain.  Skin:    General: Skin is warm and dry.     Capillary Refill: Capillary refill takes less than 2 seconds.  Neurological:     Mental Status: She is alert.     Comments: Alert and oriented x 2.  Follows most  commands. Sometimes unwilling to participate.  Follows commands, Moves all extremities  5/5 strength to BUE and BLE  Sensation intact throughout all major nerve distributions  Psychiatric:        Speech: Speech normal.     ED Results / Procedures / Treatments   Labs (all labs ordered are listed, but only abnormal results are displayed) Labs Reviewed  COMPREHENSIVE METABOLIC PANEL - Abnormal; Notable for the following components:      Result Value   Sodium 148 (*)    Glucose, Bld 131 (*)    BUN 62 (*)    Creatinine, Ser 1.05 (*)    Calcium 10.5 (*)    Total Protein 8.4 (*)    GFR, Estimated 53 (*)    All other components within normal limits  CBC WITH DIFFERENTIAL/PLATELET - Abnormal; Notable for the following components:   Monocytes Absolute 1.1 (*)    All other components within normal limits  URINALYSIS, ROUTINE W REFLEX MICROSCOPIC - Abnormal; Notable for the following components:   Color, Urine AMBER (*)    APPearance TURBID (*)    Hgb urine dipstick SMALL (*)    Protein, ur 30 (*)    Leukocytes,Ua LARGE (*)    WBC, UA >50 (*)    Bacteria, UA MANY (*)    All other components within normal limits  D-DIMER, QUANTITATIVE (NOT AT [facility]) - Abnormal; Notable for the following components:   D-Dimer, Quant >20.00 (*)    All other components within normal limits  RESP PANEL BY RT-PCR (FLU A&B, COVID) [id number]  URINE CULTURE    EKG None  Radiology DG Lumbar Spine 2-3 Views  Result Date: 05/09/2020 CLINICAL DATA:  Back pain EXAM: LUMBAR SPINE -  2-3 VIEW COMPARISON:  None. FINDINGS: somewhat limited due to technique and diffuse osteopenia. There appears to be a chronic slight anterior compression deformity of the T11 vertebral body. There is a mild levoconvex scoliotic curvature of the lumbar spine. Lumbar spine spondylosis seen with disc height loss and facet arthropathy most notable at L4-L5 and L5-S1. Scattered dense vascular calcifications are noted. IMPRESSION: Age  indeterminate slight anterior compression deformity of the T11 vertebral body with less than 25% loss in height. Electronically Signed   By: Prudencio Pair M.D.   On: 05/09/2020 21:00   CT Head Wo Contrast  Result Date: 05/09/2020 CLINICAL DATA:  Dementia, fell EXAM: CT HEAD WITHOUT CONTRAST TECHNIQUE: Contiguous axial images were obtained from the base of the skull through the vertex without intravenous contrast. COMPARISON:  07/27/2019 FINDINGS: Brain: Chronic encephalomalacia right frontal lobe. No acute infarct or hemorrhage. Lateral ventricles and midline structures are stable. No acute extra-axial fluid collections. No mass effect. Vascular: No hyperdense vessel or unexpected calcification. Skull: Stable appearance without acute or destructive lesion. Sinuses/Orbits: Polypoid mucosal thickening right ethmoid air cells. Remaining sinuses are clear. Other: None. IMPRESSION: 1. Stable exam, no acute intracranial process. Electronically Signed   By: Randa Ngo M.D.   On: 05/09/2020 20:58   DG Pelvis Portable  Result Date: 05/09/2020 CLINICAL DATA:  Pain after fall EXAM: PORTABLE PELVIS 1-2 VIEWS COMPARISON:  None. FINDINGS: The study is markedly limited due to patient positioning. No fractures are identified. IMPRESSION: Markedly limited study due to positioning with no fractures identified. Electronically Signed   By: Dorise Bullion III M.D   On: 05/09/2020 20:52   DG Chest Portable 1 View  Result Date: 05/09/2020 CLINICAL DATA:  Weakness. EXAM: PORTABLE CHEST 1 VIEW COMPARISON:  July 27, 2019 FINDINGS: Bilateral pulmonary infiltrates, left greater than right. The cardiomediastinal silhouette is stable. No other acute abnormalities. IMPRESSION: Bilateral pulmonary opacities may represent multifocal pneumonia versus pulmonary edema. Recommend clinical correlation and attention on follow-up. Electronically Signed   By: Dorise Bullion III M.D   On: 05/09/2020 20:51    Procedures Procedures  (including critical care time)  Medications Ordered in ED Medications  cefTRIAXone (ROCEPHIN) 1 g in sodium chloride 0.9 % 100 mL IVPB (has no administration in time range)  lactated ringers bolus 500 mL (has no administration in time range)  LORazepam (ATIVAN) injection 0.5 mg (0.5 mg Intravenous Given 05/09/20 1954)  methylPREDNISolone sodium succinate (SOLU-MEDROL) 125 mg/2 mL injection 80 mg (80 mg Intravenous Given 05/09/20 2118)  LORazepam (ATIVAN) injection 0.5 mg (0.5 mg Intravenous Given 05/09/20 2238)    ED Course  I have reviewed the triage vital signs and the nursing notes.  Pertinent labs & imaging results that were available during my care of the patient were reviewed by me and considered in my medical decision making (see chart for details).    MDM Rules/Calculators/A&P                          84 year old female past history of dementia who presents for evaluation feeling well.  Patient is very a very poor historian and can only tell me that she has not felt well for couple weeks but cannot specify.  She does not appear in acute distress.  Vital signs are stable.  She has poor inspiratory effort but no obvious wheezing, rales.  She has diffuse tenderness to palpation noted to multiple areas but I do not see any focal deformity.  She follows some commands but other commands, she does not want to participate in.   We will plan for labs, head CT.  I have attempted to call the nursing home multiple times with no answer.  I was unable to get in contact with anybody.  I talked with daughter.  Patient has history of dementia and will sometimes get confused.  She does report that nursing home said that she has not been feeling well over the last couple days but daughter is unclear of what exactly was going on.  She does state that patient does get frequent UTIs.  Daughter states that patient has had as needed oxygen.  Daughter does report that the lowest she knows that it was only used  at night but she states that was back in 2018.  She does not know if that has changed.  I was evaluating patient, I turned her oxygen down.  Her O2 sats dropped.  She went down consistently into the 86-88%.  I replaced her on oxygen and she improved.  Patient does have history of PE.  Not currently on any blood thinners.  Will obtain a D-dimer.  CBC shows no leukocytosis.  CMP shows BUN and creatinine of 62, 1.05.  Covid/flu is negative.  The UA does show large leukocytes, pyuria, bacteria.  Urine culture sent.  We will plan to treat her with Rocephin.  D-dimer is positive.  Head CT shows no acute abnormalities.  Chest x-ray shows bilateral pulmonary opacities that could represent multifocal pneumonia versus pulmonary edema.  She does not clinically appear fluid overloaded.  Pelvis is negative.  L-spine shows age-indeterminate slight anterior compression deformity of the T1 vertebral body.  I attempted to call daughter to update her on results but was unable to contact her.   Patient signed out to [facility], PA-C with imaging pending.   Portions of this note were generated with [employer]. Dictation errors may occur despite best attempts at proofreading.   Final Clinical Impression(s) / ED Diagnoses Final diagnoses:  Acute cystitis without hematuria  Generalized weakness  Hypoxia    Rx / DC Orders ED Discharge Orders    None       Desma Mcgregor 05/10/20 Prudence Davidson, MD 05/11/20 0021

## 2020-05-09 NOTE — ED Triage Notes (Signed)
Pt BIB EMS from [facility] c/o back pain and malaise x 1 week. Tender upon palpation, no deformity or discoloration. Denies fall. Hx of dementia.

## 2020-05-10 ENCOUNTER — Encounter (HOSPITAL_COMMUNITY): Payer: Self-pay

## 2020-05-10 ENCOUNTER — Emergency Department (HOSPITAL_COMMUNITY): Payer: Medicare Other

## 2020-05-10 ENCOUNTER — Inpatient Hospital Stay (HOSPITAL_COMMUNITY): Payer: Medicare Other

## 2020-05-10 DIAGNOSIS — I444 Left anterior fascicular block: Secondary | ICD-10-CM | POA: Diagnosis present

## 2020-05-10 DIAGNOSIS — Z7722 Contact with and (suspected) exposure to environmental tobacco smoke (acute) (chronic): Secondary | ICD-10-CM | POA: Diagnosis present

## 2020-05-10 DIAGNOSIS — N390 Urinary tract infection, site not specified: Secondary | ICD-10-CM | POA: Diagnosis not present

## 2020-05-10 DIAGNOSIS — J9611 Chronic respiratory failure with hypoxia: Secondary | ICD-10-CM | POA: Diagnosis present

## 2020-05-10 DIAGNOSIS — R7989 Other specified abnormal findings of blood chemistry: Secondary | ICD-10-CM

## 2020-05-10 DIAGNOSIS — S32010A Wedge compression fracture of first lumbar vertebra, initial encounter for closed fracture: Secondary | ICD-10-CM

## 2020-05-10 DIAGNOSIS — R443 Hallucinations, unspecified: Secondary | ICD-10-CM | POA: Diagnosis present

## 2020-05-10 DIAGNOSIS — N179 Acute kidney failure, unspecified: Secondary | ICD-10-CM | POA: Diagnosis present

## 2020-05-10 DIAGNOSIS — R54 Age-related physical debility: Secondary | ICD-10-CM | POA: Diagnosis present

## 2020-05-10 DIAGNOSIS — R531 Weakness: Secondary | ICD-10-CM

## 2020-05-10 DIAGNOSIS — R413 Other amnesia: Secondary | ICD-10-CM

## 2020-05-10 DIAGNOSIS — E870 Hyperosmolality and hypernatremia: Secondary | ICD-10-CM | POA: Diagnosis present

## 2020-05-10 DIAGNOSIS — R8271 Bacteriuria: Secondary | ICD-10-CM | POA: Diagnosis not present

## 2020-05-10 DIAGNOSIS — W19XXXA Unspecified fall, initial encounter: Secondary | ICD-10-CM | POA: Diagnosis present

## 2020-05-10 DIAGNOSIS — Z8249 Family history of ischemic heart disease and other diseases of the circulatory system: Secondary | ICD-10-CM | POA: Diagnosis not present

## 2020-05-10 DIAGNOSIS — Z9981 Dependence on supplemental oxygen: Secondary | ICD-10-CM | POA: Diagnosis not present

## 2020-05-10 DIAGNOSIS — I10 Essential (primary) hypertension: Secondary | ICD-10-CM | POA: Diagnosis present

## 2020-05-10 DIAGNOSIS — S32000A Wedge compression fracture of unspecified lumbar vertebra, initial encounter for closed fracture: Secondary | ICD-10-CM | POA: Diagnosis not present

## 2020-05-10 DIAGNOSIS — S22080A Wedge compression fracture of T11-T12 vertebra, initial encounter for closed fracture: Secondary | ICD-10-CM

## 2020-05-10 DIAGNOSIS — M4856XA Collapsed vertebra, not elsewhere classified, lumbar region, initial encounter for fracture: Secondary | ICD-10-CM | POA: Diagnosis present

## 2020-05-10 DIAGNOSIS — N3000 Acute cystitis without hematuria: Secondary | ICD-10-CM

## 2020-05-10 DIAGNOSIS — S22000A Wedge compression fracture of unspecified thoracic vertebra, initial encounter for closed fracture: Secondary | ICD-10-CM | POA: Diagnosis not present

## 2020-05-10 DIAGNOSIS — J449 Chronic obstructive pulmonary disease, unspecified: Secondary | ICD-10-CM | POA: Diagnosis present

## 2020-05-10 DIAGNOSIS — Z823 Family history of stroke: Secondary | ICD-10-CM | POA: Diagnosis not present

## 2020-05-10 DIAGNOSIS — M549 Dorsalgia, unspecified: Secondary | ICD-10-CM | POA: Diagnosis present

## 2020-05-10 DIAGNOSIS — Z800 Family history of malignant neoplasm of digestive organs: Secondary | ICD-10-CM | POA: Diagnosis not present

## 2020-05-10 DIAGNOSIS — E785 Hyperlipidemia, unspecified: Secondary | ICD-10-CM | POA: Diagnosis present

## 2020-05-10 DIAGNOSIS — Y92099 Unspecified place in other non-institutional residence as the place of occurrence of the external cause: Secondary | ICD-10-CM | POA: Diagnosis not present

## 2020-05-10 DIAGNOSIS — Z86711 Personal history of pulmonary embolism: Secondary | ICD-10-CM | POA: Diagnosis not present

## 2020-05-10 DIAGNOSIS — B9620 Unspecified Escherichia coli [E. coli] as the cause of diseases classified elsewhere: Secondary | ICD-10-CM | POA: Diagnosis present

## 2020-05-10 DIAGNOSIS — Z87820 Personal history of traumatic brain injury: Secondary | ICD-10-CM | POA: Diagnosis not present

## 2020-05-10 DIAGNOSIS — M19019 Primary osteoarthritis, unspecified shoulder: Secondary | ICD-10-CM | POA: Diagnosis present

## 2020-05-10 DIAGNOSIS — F0280 Dementia in other diseases classified elsewhere without behavioral disturbance: Secondary | ICD-10-CM | POA: Diagnosis present

## 2020-05-10 DIAGNOSIS — G309 Alzheimer's disease, unspecified: Secondary | ICD-10-CM | POA: Diagnosis present

## 2020-05-10 DIAGNOSIS — R52 Pain, unspecified: Secondary | ICD-10-CM

## 2020-05-10 DIAGNOSIS — Z20822 Contact with and (suspected) exposure to covid-19: Secondary | ICD-10-CM | POA: Diagnosis present

## 2020-05-10 DIAGNOSIS — M4854XA Collapsed vertebra, not elsewhere classified, thoracic region, initial encounter for fracture: Secondary | ICD-10-CM | POA: Diagnosis present

## 2020-05-10 LAB — CBC
HCT: 45.1 % (ref 36.0–46.0)
Hemoglobin: 14 g/dL (ref 12.0–15.0)
MCH: 30.2 pg (ref 26.0–34.0)
MCHC: 31 g/dL (ref 30.0–36.0)
MCV: 97.4 fL (ref 80.0–100.0)
Platelets: 200 10*3/uL (ref 150–400)
RBC: 4.63 MIL/uL (ref 3.87–5.11)
RDW: 13.1 % (ref 11.5–15.5)
WBC: 6.3 10*3/uL (ref 4.0–10.5)
nRBC: 0 % (ref 0.0–0.2)

## 2020-05-10 LAB — BASIC METABOLIC PANEL
Anion gap: 10 (ref 5–15)
BUN: 59 mg/dL — ABNORMAL HIGH (ref 8–23)
CO2: 28 mmol/L (ref 22–32)
Calcium: 10.1 mg/dL (ref 8.9–10.3)
Chloride: 110 mmol/L (ref 98–111)
Creatinine, Ser: 0.82 mg/dL (ref 0.44–1.00)
GFR, Estimated: >60 mL/min (ref >60)
Glucose, Bld: 143 mg/dL — ABNORMAL HIGH (ref 70–99)
Potassium: 4.3 mmol/L (ref 3.5–5.1)
Sodium: 148 mmol/L — ABNORMAL HIGH (ref 135–145)

## 2020-05-10 MED ORDER — VITAMIN D3 25 MCG (1000 UNIT) PO TABS
2000.0000 [IU] | ORAL_TABLET | Freq: Every day | ORAL | Status: DC
  Administered 2020-05-10 – 2020-05-14 (×5): 2000 [IU] via ORAL
  Filled 2020-05-10 (×10): qty 2

## 2020-05-10 MED ORDER — QUETIAPINE FUMARATE 25 MG PO TABS
25.0000 mg | ORAL_TABLET | Freq: Every day | ORAL | Status: DC
  Administered 2020-05-10 – 2020-05-14 (×5): 25 mg via ORAL
  Filled 2020-05-10 (×5): qty 1

## 2020-05-10 MED ORDER — ACETAMINOPHEN 325 MG PO TABS
650.0000 mg | ORAL_TABLET | Freq: Four times a day (QID) | ORAL | Status: DC | PRN
  Administered 2020-05-13 – 2020-05-14 (×3): 650 mg via ORAL
  Filled 2020-05-10 (×3): qty 2

## 2020-05-10 MED ORDER — MEMANTINE HCL 10 MG PO TABS
10.0000 mg | ORAL_TABLET | Freq: Two times a day (BID) | ORAL | Status: DC
  Administered 2020-05-10 – 2020-05-14 (×10): 10 mg via ORAL
  Filled 2020-05-10 (×10): qty 1

## 2020-05-10 MED ORDER — ONDANSETRON HCL 4 MG PO TABS: 4.0000 mg | ORAL_TABLET | Freq: Four times a day (QID) | ORAL | Status: DC | PRN

## 2020-05-10 MED ORDER — ONDANSETRON HCL 4 MG/2ML IJ SOLN: 4.0000 mg | Freq: Four times a day (QID) | INTRAMUSCULAR | Status: DC | PRN

## 2020-05-10 MED ORDER — NEBIVOLOL HCL 10 MG PO TABS
10.0000 mg | ORAL_TABLET | Freq: Every day | ORAL | Status: DC
  Administered 2020-05-10 – 2020-05-14 (×5): 10 mg via ORAL
  Filled 2020-05-10 (×5): qty 1

## 2020-05-10 MED ORDER — POLYVINYL ALCOHOL 1.4 % OP SOLN
2.0000 [drp] | OPHTHALMIC | Status: DC | PRN
  Filled 2020-05-10: qty 15

## 2020-05-10 MED ORDER — ALBUTEROL SULFATE HFA 108 (90 BASE) MCG/ACT IN AERS: 2.0000 | INHALATION_SPRAY | Freq: Four times a day (QID) | RESPIRATORY_TRACT | Status: DC | PRN

## 2020-05-10 MED ORDER — LAMOTRIGINE 100 MG PO TABS
100.0000 mg | ORAL_TABLET | Freq: Two times a day (BID) | ORAL | Status: DC
  Administered 2020-05-10 – 2020-05-14 (×10): 100 mg via ORAL
  Filled 2020-05-10 (×10): qty 1

## 2020-05-10 MED ORDER — ALBUTEROL SULFATE (2.5 MG/3ML) 0.083% IN NEBU: 2.5000 mg | INHALATION_SOLUTION | Freq: Two times a day (BID) | RESPIRATORY_TRACT | Status: DC | PRN

## 2020-05-10 MED ORDER — ENOXAPARIN SODIUM 40 MG/0.4ML ~~LOC~~ SOLN
40.0000 mg | SUBCUTANEOUS | Status: DC
  Administered 2020-05-10 – 2020-05-14 (×5): 40 mg via SUBCUTANEOUS
  Filled 2020-05-10 (×5): qty 0.4

## 2020-05-10 MED ORDER — LACTATED RINGERS IV BOLUS
500.0000 mL | Freq: Once | INTRAVENOUS | Status: AC
  Administered 2020-05-10: 500 mL via INTRAVENOUS

## 2020-05-10 MED ORDER — ACETAMINOPHEN 650 MG RE SUPP: 650.0000 mg | Freq: Four times a day (QID) | RECTAL | Status: DC | PRN

## 2020-05-10 MED ORDER — LACTATED RINGERS IV SOLN: INTRAVENOUS | Status: DC

## 2020-05-10 MED ORDER — IOHEXOL 350 MG/ML SOLN
100.0000 mL | Freq: Once | INTRAVENOUS | Status: AC | PRN
  Administered 2020-05-10: 100 mL via INTRAVENOUS

## 2020-05-10 MED ORDER — HYDROCODONE-ACETAMINOPHEN 5-325 MG PO TABS
1.0000 | ORAL_TABLET | ORAL | Status: DC | PRN
  Administered 2020-05-10 – 2020-05-14 (×9): 1 via ORAL
  Filled 2020-05-10 (×9): qty 1

## 2020-05-10 MED ORDER — ALUM & MAG HYDROXIDE-SIMETH 200-200-20 MG/5ML PO SUSP: 30.0000 mL | Freq: Four times a day (QID) | ORAL | Status: DC | PRN

## 2020-05-10 NOTE — Progress Notes (Signed)
Bilateral lower extremity venous duplex completed. Refer to "CV Proc" under chart review to view preliminary results.  05/10/2020 4:19 PM Kelby Aline., MHA, RVT, RDCS, RDMS

## 2020-05-10 NOTE — Plan of Care (Signed)
  Problem: Nutrition: Goal: Adequate nutrition will be maintained Outcome: Progressing   Problem: Elimination: Goal: Will not experience complications related to urinary retention Outcome: Progressing   Problem: Pain Managment: Goal: General experience of comfort will improve Outcome: Progressing   Problem: Safety: Goal: Ability to remain free from injury will improve Outcome: Progressing   

## 2020-05-10 NOTE — ED Provider Notes (Signed)
Assumed care of patient from Providence Lanius, PA-C at change of shift pending CT imaging and consult for admission.  Please see prior runner note for full H&P.  Briefly patient here from [facility] care facility, difficulty contacting staff directly, prior provider spoke with patient's daughter who relayed that the patient has been staying in her room more because she did not feel well but nursing home did not tell her specifically what was going on.   She has been found to have a urinary tract infection, given Rocephin, she also has significantly elevated BUN at 62, fluids will be given.  With her elevated D-dimer plan is for CT angio of the chest, also will obtain CT abdomen/pelvis  1.  No central, segmental, or subsegmental pulmonary embolism. 2. Interstitial/patchy airspace opacities at both lung bases which may be due to chronic lung changes/atelectasis. 3. Acute superior compression fracture of the T11 vertebral body with less than 25% loss in height. No retropulsion of fragments. 4. Age indeterminate slight superior compression fracture of the L1 vertebral body with less than 25% loss in height. 5. Cholelithiasis 6. Mild diffuse bladder wall thickening which may be due to chronic cystitis. 7.  Aortic Atherosclerosis (ICD10-I70.0).  I discussed with hospitalist Dr. Alcario Drought who accepts admission.    Results for orders placed or performed during the hospital encounter of 05/09/20  Resp Panel by RT-PCR (Flu A&B, Covid) Nasopharyngeal Swab   Specimen: Nasopharyngeal Swab; Nasopharyngeal(NP) swabs in vial transport medium  Result Value Ref Range   SARS Coronavirus 2 by RT PCR NEGATIVE NEGATIVE   Influenza A by PCR NEGATIVE NEGATIVE   Influenza B by PCR NEGATIVE NEGATIVE  Comprehensive metabolic panel  Result Value Ref Range   Sodium 148 (H) 135 - 145 mmol/L   Potassium 4.0 3.5 - 5.1 mmol/L   Chloride 108 98 - 111 mmol/L   CO2 27 22 - 32 mmol/L   Glucose, Bld 131 (H) 70 - 99 mg/dL   BUN  62 (H) 8 - 23 mg/dL   Creatinine, Ser 1.05 (H) 0.44 - 1.00 mg/dL   Calcium 10.5 (H) 8.9 - 10.3 mg/dL   Total Protein 8.4 (H) 6.5 - 8.1 g/dL   Albumin 4.0 3.5 - 5.0 g/dL   AST 21 15 - 41 U/L   ALT 19 0 - 44 U/L   Alkaline Phosphatase 92 38 - 126 U/L   Total Bilirubin 1.2 0.3 - 1.2 mg/dL   GFR, Estimated 53 (L) >60 mL/min   Anion gap 13 5 - 15  CBC with Differential  Result Value Ref Range   WBC 10.0 4.0 - 10.5 K/uL   RBC 4.66 3.87 - 5.11 MIL/uL   Hemoglobin 14.2 12.0 - 15.0 g/dL   HCT 44.2 36 - 46 %   MCV 94.8 80.0 - 100.0 fL   MCH 30.5 26.0 - 34.0 pg   MCHC 32.1 30.0 - 36.0 g/dL   RDW 13.0 11.5 - 15.5 %   Platelets 250 150 - 400 K/uL   nRBC 0.0 0.0 - 0.2 %   Neutrophils Relative % 64 %   Neutro Abs 6.5 1.7 - 7.7 K/uL   Lymphocytes Relative 20 %   Lymphs Abs 2.0 0.7 - 4.0 K/uL   Monocytes Relative 11 %   Monocytes Absolute 1.1 (H) 0.1 - 1.0 K/uL   Eosinophils Relative 4 %   Eosinophils Absolute 0.4 0.0 - 0.5 K/uL   Basophils Relative 1 %   Basophils Absolute 0.1 0.0 - 0.1 K/uL  Immature Granulocytes 0 %   Abs Immature Granulocytes 0.03 0.00 - 0.07 K/uL  Urinalysis, Routine w reflex microscopic Urine, Catheterized  Result Value Ref Range   Color, Urine AMBER (A) YELLOW   APPearance TURBID (A) CLEAR   Specific Gravity, Urine 1.017 1.005 - 1.030   pH 7.0 5.0 - 8.0   Glucose, UA NEGATIVE NEGATIVE mg/dL   Hgb urine dipstick SMALL (A) NEGATIVE   Bilirubin Urine NEGATIVE NEGATIVE   Ketones, ur NEGATIVE NEGATIVE mg/dL   Protein, ur 30 (A) NEGATIVE mg/dL   Nitrite NEGATIVE NEGATIVE   Leukocytes,Ua LARGE (A) NEGATIVE   RBC / HPF 21-50 0 - 5 RBC/hpf   WBC, UA >50 (H) 0 - 5 WBC/hpf   Bacteria, UA MANY (A) NONE SEEN   WBC Clumps PRESENT    Mucus PRESENT   D-dimer, quantitative (not at [facility])  Result Value Ref Range   D-Dimer, Quant >20.00 (H) 0.00 - 0.50 ug/mL-FEU   DG Lumbar Spine 2-3 Views  Result Date: 05/09/2020 CLINICAL DATA:  Back pain EXAM: LUMBAR SPINE - 2-3 VIEW  COMPARISON:  None. FINDINGS: somewhat limited due to technique and diffuse osteopenia. There appears to be a chronic slight anterior compression deformity of the T11 vertebral body. There is a mild levoconvex scoliotic curvature of the lumbar spine. Lumbar spine spondylosis seen with disc height loss and facet arthropathy most notable at L4-L5 and L5-S1. Scattered dense vascular calcifications are noted. IMPRESSION: Age indeterminate slight anterior compression deformity of the T11 vertebral body with less than 25% loss in height. Electronically Signed   By: Prudencio Pair M.D.   On: 05/09/2020 21:00   CT Head Wo Contrast  Result Date: 05/09/2020 CLINICAL DATA:  Dementia, fell EXAM: CT HEAD WITHOUT CONTRAST TECHNIQUE: Contiguous axial images were obtained from the base of the skull through the vertex without intravenous contrast. COMPARISON:  07/27/2019 FINDINGS: Brain: Chronic encephalomalacia right frontal lobe. No acute infarct or hemorrhage. Lateral ventricles and midline structures are stable. No acute extra-axial fluid collections. No mass effect. Vascular: No hyperdense vessel or unexpected calcification. Skull: Stable appearance without acute or destructive lesion. Sinuses/Orbits: Polypoid mucosal thickening right ethmoid air cells. Remaining sinuses are clear. Other: None. IMPRESSION: 1. Stable exam, no acute intracranial process. Electronically Signed   By: Randa Ngo M.D.   On: 05/09/2020 20:58   CT Angio Chest PE W and/or Wo Contrast  Result Date: 05/10/2020 CLINICAL DATA:  Is generalized weakness EXAM: CT ANGIOGRAPHY CHEST WITH CONTRAST TECHNIQUE: Multidetector CT imaging of the chest was performed using the standard protocol during bolus administration of intravenous contrast. Multiplanar CT image reconstructions and MIPs were obtained to evaluate the vascular anatomy. CONTRAST:  149mL OMNIPAQUE IOHEXOL 350 MG/ML SOLN COMPARISON:  None. FINDINGS: Cardiovascular: There is a optimal  opacification of the pulmonary arteries. There is no central,segmental, or subsegmental filling defects within the pulmonary arteries. The heart is normal in size. No pericardial effusion or thickening. No evidence right heart strain. There is normal three-vessel brachiocephalic anatomy without proximal stenosis. Scattered aortic atherosclerosis is noted. Mediastinum/Nodes: No hilar, mediastinal, or axillary adenopathy. Thyroid gland, trachea, and esophagus demonstrate no significant findings. There is elevation of the right hemidiaphragm. Lungs/Pleura: Mildly increased interstitial markings with patchy airspace opacity seen at both lung bases. No pleural effusion or pneumothorax. No airspace consolidation. Upper Abdomen: No acute abnormalities present in the visualized portions of the upper abdomen. Musculoskeletal: There is a acute superior compression fracture of the T11 vertebral body with less than 25% loss in  height. No retropulsion of fragments is seen. There is slight buckling of the anterior superior cortex. There is also a age indeterminate superior compression deformity of the L1 vertebral body with less than 25% loss in height. Review of the MIP images confirms the above findings. Abdomen/pelvis: Hepatobiliary: Scattered small hypodense lesions are seen throughout the liver parenchyma the largest within the inferior left liver lobe measuring 2 cm. Main portal vein is patent. A partially calcified gallstone is present. Pancreas:  Fatty atrophy seen throughout the pancreas.  For. Spleen: Normal in size without focal abnormality. Adrenals/Urinary Tract: Both adrenal glands appear normal. There is a multilocular low-density lesions seen within the upper pole the left kidney measuring 5 cm. No renal or collecting system calculi. The partially decompressed bladder appears to have mild superior bladder wall thickening with question of hyperenhancement and fat stranding changes. Stomach/Bowel: The stomach, small  bowel, are normal in appearance. There is a moderate amount of colonic stool with scattered colonic diverticula. A mildly dilated rectum filled with air and stool is seen. Vascular/Lymphatic: There are no enlarged mesenteric, retroperitoneal, or pelvic lymph nodes. Scattered aortic atherosclerotic calcifications are seen without aneurysmal dilatation. Reproductive: The patient is status post hysterectomy. No adnexal masses or collections seen. Other: No evidence of abdominal wall mass or hernia. Musculoskeletal: No acute or significant osseous findings. IMPRESSION: 1.  No central, segmental, or subsegmental pulmonary embolism. 2. Interstitial/patchy airspace opacities at both lung bases which may be due to chronic lung changes/atelectasis. 3. Acute superior compression fracture of the T11 vertebral body with less than 25% loss in height. No retropulsion of fragments. 4. Age indeterminate slight superior compression fracture of the L1 vertebral body with less than 25% loss in height. 5. Cholelithiasis 6. Mild diffuse bladder wall thickening which may be due to chronic cystitis. 7.  Aortic Atherosclerosis (ICD10-I70.0). Electronically Signed   By: Prudencio Pair M.D.   On: 05/10/2020 01:39   CT ABDOMEN PELVIS W CONTRAST  Result Date: 05/10/2020 CLINICAL DATA:  Is generalized weakness EXAM: CT ANGIOGRAPHY CHEST WITH CONTRAST TECHNIQUE: Multidetector CT imaging of the chest was performed using the standard protocol during bolus administration of intravenous contrast. Multiplanar CT image reconstructions and MIPs were obtained to evaluate the vascular anatomy. CONTRAST:  147mL OMNIPAQUE IOHEXOL 350 MG/ML SOLN COMPARISON:  None. FINDINGS: Cardiovascular: There is a optimal opacification of the pulmonary arteries. There is no central,segmental, or subsegmental filling defects within the pulmonary arteries. The heart is normal in size. No pericardial effusion or thickening. No evidence right heart strain. There is normal  three-vessel brachiocephalic anatomy without proximal stenosis. Scattered aortic atherosclerosis is noted. Mediastinum/Nodes: No hilar, mediastinal, or axillary adenopathy. Thyroid gland, trachea, and esophagus demonstrate no significant findings. There is elevation of the right hemidiaphragm. Lungs/Pleura: Mildly increased interstitial markings with patchy airspace opacity seen at both lung bases. No pleural effusion or pneumothorax. No airspace consolidation. Upper Abdomen: No acute abnormalities present in the visualized portions of the upper abdomen. Musculoskeletal: There is a acute superior compression fracture of the T11 vertebral body with less than 25% loss in height. No retropulsion of fragments is seen. There is slight buckling of the anterior superior cortex. There is also a age indeterminate superior compression deformity of the L1 vertebral body with less than 25% loss in height. Review of the MIP images confirms the above findings. Abdomen/pelvis: Hepatobiliary: Scattered small hypodense lesions are seen throughout the liver parenchyma the largest within the inferior left liver lobe measuring 2 cm. Main portal vein is  patent. A partially calcified gallstone is present. Pancreas:  Fatty atrophy seen throughout the pancreas.  For. Spleen: Normal in size without focal abnormality. Adrenals/Urinary Tract: Both adrenal glands appear normal. There is a multilocular low-density lesions seen within the upper pole the left kidney measuring 5 cm. No renal or collecting system calculi. The partially decompressed bladder appears to have mild superior bladder wall thickening with question of hyperenhancement and fat stranding changes. Stomach/Bowel: The stomach, small bowel, are normal in appearance. There is a moderate amount of colonic stool with scattered colonic diverticula. A mildly dilated rectum filled with air and stool is seen. Vascular/Lymphatic: There are no enlarged mesenteric, retroperitoneal, or  pelvic lymph nodes. Scattered aortic atherosclerotic calcifications are seen without aneurysmal dilatation. Reproductive: The patient is status post hysterectomy. No adnexal masses or collections seen. Other: No evidence of abdominal wall mass or hernia. Musculoskeletal: No acute or significant osseous findings. IMPRESSION: 1.  No central, segmental, or subsegmental pulmonary embolism. 2. Interstitial/patchy airspace opacities at both lung bases which may be due to chronic lung changes/atelectasis. 3. Acute superior compression fracture of the T11 vertebral body with less than 25% loss in height. No retropulsion of fragments. 4. Age indeterminate slight superior compression fracture of the L1 vertebral body with less than 25% loss in height. 5. Cholelithiasis 6. Mild diffuse bladder wall thickening which may be due to chronic cystitis. 7.  Aortic Atherosclerosis (ICD10-I70.0). Electronically Signed   By: Prudencio Pair M.D.   On: 05/10/2020 01:39   DG Pelvis Portable  Result Date: 05/09/2020 CLINICAL DATA:  Pain after fall EXAM: PORTABLE PELVIS 1-2 VIEWS COMPARISON:  None. FINDINGS: The study is markedly limited due to patient positioning. No fractures are identified. IMPRESSION: Markedly limited study due to positioning with no fractures identified. Electronically Signed   By: Dorise Bullion III M.D   On: 05/09/2020 20:52   DG Chest Portable 1 View  Result Date: 05/09/2020 CLINICAL DATA:  Weakness. EXAM: PORTABLE CHEST 1 VIEW COMPARISON:  July 27, 2019 FINDINGS: Bilateral pulmonary infiltrates, left greater than right. The cardiomediastinal silhouette is stable. No other acute abnormalities. IMPRESSION: Bilateral pulmonary opacities may represent multifocal pneumonia versus pulmonary edema. Recommend clinical correlation and attention on follow-up. Electronically Signed   By: Dorise Bullion III M.D   On: 05/09/2020 20:51      Katrell Milhorn, Glynda Jaeger, PA-C 05/10/20 [id number]    Davonna Belling,  MD 05/11/20 0021

## 2020-05-10 NOTE — H&P (Addendum)
History and Physical    Kristina Watson JSE:[id number] DOB: August 10, 1935 DOA: 05/09/2020  PCP: Pcp, No  Patient coming from: SNF  I have personally briefly reviewed patient's old medical records in [place]  Chief Complaint: Back pain, fatigue  HPI: Kristina Watson is a 84 y.o. female with medical history significant of COPD on home O2, HTN, dementia, prior PE not on anticoagulation.  Pt presents to the ED from SNF.  Reportedly pt less interactive over past few days.  Had fall a week ago per SNF.  Pt with back pain since that time.  Per EDP note: "I talked with daughter who states that she will frequently get confused and have hallucinations.  Daughter states that she had noted when she visited a few days ago that patient had been staying in her room more because she did not feel well but nursing home did not clarify specifically what was going on.  Daughter does not know of any reported falls.  Daughter does report the patient has been on home O2 as needed.  Patient has not been vaccinated for Covid."  From Neurologist note in June: Pt has h/o TBI from [facility] at age 85, but had been highly functional until 2015 when she developed onset of dementia.  She has had hallucinations since Jan 2020.   ED Course: After a very extensive ED work up: 1) Acute T11 compression fracture, and age-indeterminate L1 compression fracture (no retropulsion, both < 25% height loss). 2) UTI.  Also has D.Dimer > 20 but no PE.   Review of Systems: Unable to perform due to mental status  Past Medical History:  Diagnosis Date  . Arthritis    "legs" (01/09/2017)  . Asthma with COPD (chronic obstructive pulmonary disease) ([place])   . Chronic bronchitis ([place])   . COPD (chronic obstructive pulmonary disease) ([place])   . Hyperlipidemia   . Hypertension   . Memory loss   . Multiple environmental allergies   . Obesity (BMI 35.0-39.9 without comorbidity)   . On home oxygen therapy    "1.5L usually when she sleeps"  (01/09/2017)  . Osteoarthritis of shoulder   . Pneumonia    "a couple times" (8//11/2016)  . Pulmonary embolism ([place]) ?2005  . Tachycardia     Past Surgical History:  Procedure Laterality Date  . ABDOMINAL HYSTERECTOMY    . CARDIAC CATHETERIZATION    . CATARACT EXTRACTION, BILATERAL Bilateral   . DILATION AND CURETTAGE OF UTERUS    . LEFT HEART CATHETERIZATION WITH CORONARY ANGIOGRAM N/A 09/01/2011   Procedure: LEFT HEART CATHETERIZATION WITH CORONARY ANGIOGRAM;  Surgeon: Leonie Man, MD;  Location: [facility] C[facility] LAB;  Service: Cardiovascular;  Laterality: N/A;  . TONSILLECTOMY       reports that she has never smoked. She has never used smokeless tobacco. She reports that she does not drink alcohol and does not use drugs.  Allergies  Allergen Reactions  . Warfarin Sodium Nausea And Vomiting    Bothers stomach    Family History  Problem Relation Age of Onset  . Cancer Mother        pancreatic  . CAD Father   . Heart failure Brother        congenital heart disease  . Stroke Brother      Prior to Admission medications   Medication Sig Start Date End Date Taking? Authorizing Provider  acetaminophen (TYLENOL) 500 MG tablet Take 500 mg by mouth every 4 (four) hours as needed for mild pain,  fever or headache.    Yes [provider]  albuterol (PROVENTIL HFA;VENTOLIN HFA) 108 (90 Base) MCG/ACT inhaler Inhale 2 puffs into the lungs every 6 (six) hours as needed for wheezing or shortness of breath. 01/13/17  Yes Florencia Reasons, MD  albuterol (PROVENTIL) (2.5 MG/3ML) 0.083% nebulizer solution Take 2.5 mg by nebulization 2 (two) times daily as needed for shortness of breath.    Yes [provider]  alum & mag hydroxide-simeth (MAALOX/MYLANTA) 200-200-20 MG/5ML suspension Take 30 mLs by mouth every 6 (six) hours as needed for indigestion or heartburn.   Yes [provider]  BYSTOLIC 10 MG tablet TAKE 1 TABLET BY MOUTH DAILY** NEED APPOINTMENT** Patient taking  differently: Take 10 mg by mouth daily.  04/30/13  Yes Leonie Man, MD  guaifenesin (ROBITUSSIN) 100 MG/5ML syrup Take 200 mg by mouth every 6 (six) hours as needed for cough.   Yes [provider]  lamoTRIgine (LAMICTAL) 100 MG tablet Take 1 tablet (100 mg total) by mouth 2 (two) times daily. Please call [phone] to schedule follow up. Patient taking differently: Take 100 mg by mouth 2 (two) times daily.  05/21/19  Yes Marcial Pacas, MD  loperamide (IMODIUM) 2 MG capsule Take 2 mg by mouth as needed for diarrhea or loose stools. Don't exceed  more than 8 doses in 24 hours   Yes [provider]  magnesium hydroxide (MILK OF MAGNESIA) 400 MG/5ML suspension Take 30 mLs by mouth at bedtime as needed for mild constipation.   Yes [provider]  memantine (NAMENDA) 10 MG tablet Take 1 tablet (10 mg total) by mouth 2 (two) times daily. Patient taking differently: Take 10 mg by mouth 2 (two) times daily.  11/07/19  Yes Suzzanne Cloud, NP  neomycin-bacitracin-polymyxin (NEOSPORIN) [phone] ointment Apply 1 application topically every 6 (six) hours as needed (abrasions).   Yes [provider]  OXYGEN Inhale 2 L/min into the lungs as needed (for shortness of breath).    Yes [provider]  QUEtiapine (SEROQUEL) 25 MG tablet Take 1 tablet (25 mg total) by mouth at bedtime. Please call [phone] to schedule an appt. 01/16/19  Yes Marcial Pacas, MD  Vitamin D, Cholecalciferol, 25 MCG (1000 UT) TABS Take 50 mcg by mouth daily.   Yes [provider]  Multiple Vitamin (MULTIVITAMIN) tablet Take 1 tablet by mouth daily.    [provider]    Physical Exam: Vitals:   05/10/20 0000 05/10/20 0002 05/10/20 0030 05/10/20 0229  BP: (!) 100/57 126/79 106/65 (!) 106/56  Pulse: 70 69 73 76  Resp: 20 17 18 18   Temp:    98 F (36.7 C)  TempSrc:    Oral  SpO2: 93%  94% 95%    Constitutional: NAD, calm, comfortable Eyes: PERRL, lids and conjunctivae  normal ENMT: Mucous membranes are moist. Posterior pharynx clear of any exudate or lesions.Normal dentition.  Neck: normal, supple, no masses, no thyromegaly Respiratory: clear to auscultation bilaterally, no wheezing, no crackles. Normal respiratory effort. No accessory muscle use.  Cardiovascular: Regular rate and rhythm, no murmurs / rubs / gallops. No extremity edema. 2+ pedal pulses. No carotid bruits.  Abdomen: no tenderness, no masses palpated. No hepatosplenomegaly. Bowel sounds positive.  Musculoskeletal: no clubbing / cyanosis. No joint deformity upper and lower extremities. Good ROM, no contractures. Normal muscle tone.  Skin: no rashes, lesions, ulcers. No induration Neurologic: CN 2-12 grossly intact. Sensation intact, DTR normal. Strength 5/5 in all 4.  Psychiatric: Oriented  to self and location   Labs on Admission: I have personally reviewed following labs and imaging studies  CBC: Recent Labs  Lab 05/09/20 1750  WBC 10.0  NEUTROABS 6.5  HGB 14.2  HCT 44.2  MCV 94.8  PLT 409   Basic Metabolic Panel: Recent Labs  Lab 05/09/20 1750  NA 148*  K 4.0  CL 108  CO2 27  GLUCOSE 131*  BUN 62*  CREATININE 1.05*  CALCIUM 10.5*   GFR: CrCl cannot be calculated (Unknown ideal weight.). Liver Function Tests: Recent Labs  Lab 05/09/20 1750  AST 21  ALT 19  ALKPHOS 92  BILITOT 1.2  PROT 8.4*  ALBUMIN 4.0   No results for input(s): LIPASE, AMYLASE in the last 168 hours. No results for input(s): AMMONIA in the last 168 hours. Coagulation Profile: No results for input(s): INR, PROTIME in the last 168 hours. Cardiac Enzymes: No results for input(s): CKTOTAL, CKMB, CKMBINDEX, TROPONINI in the last 168 hours. BNP (last 3 results) No results for input(s): PROBNP in the last 8760 hours. HbA1C: No results for input(s): HGBA1C in the last 72 hours. CBG: No results for input(s): GLUCAP in the last 168 hours. Lipid Profile: No results for input(s): CHOL, HDL,  LDLCALC, TRIG, CHOLHDL, LDLDIRECT in the last 72 hours. Thyroid Function Tests: No results for input(s): TSH, T4TOTAL, FREET4, T3FREE, THYROIDAB in the last 72 hours. Anemia Panel: No results for input(s): VITAMINB12, FOLATE, FERRITIN, TIBC, IRON, RETICCTPCT in the last 72 hours. Urine analysis:    Component Value Date/Time   COLORURINE AMBER (A) 05/09/2020 1750   APPEARANCEUR TURBID (A) 05/09/2020 1750   LABSPEC 1.017 05/09/2020 1750   PHURINE 7.0 05/09/2020 1750   GLUCOSEU NEGATIVE 05/09/2020 1750   HGBUR SMALL (A) 05/09/2020 1750   BILIRUBINUR NEGATIVE 05/09/2020 1750   KETONESUR NEGATIVE 05/09/2020 1750   PROTEINUR 30 (A) 05/09/2020 1750   UROBILINOGEN 0.2 09/22/2008 2046   NITRITE NEGATIVE 05/09/2020 1750   LEUKOCYTESUR LARGE (A) 05/09/2020 1750    Radiological Exams on Admission: DG Lumbar Spine 2-3 Views  Result Date: 05/09/2020 CLINICAL DATA:  Back pain EXAM: LUMBAR SPINE - 2-3 VIEW COMPARISON:  None. FINDINGS: somewhat limited due to technique and diffuse osteopenia. There appears to be a chronic slight anterior compression deformity of the T11 vertebral body. There is a mild levoconvex scoliotic curvature of the lumbar spine. Lumbar spine spondylosis seen with disc height loss and facet arthropathy most notable at L4-L5 and L5-S1. Scattered dense vascular calcifications are noted. IMPRESSION: Age indeterminate slight anterior compression deformity of the T11 vertebral body with less than 25% loss in height. Electronically Signed   By: Prudencio Pair M.D.   On: 05/09/2020 21:00   CT Head Wo Contrast  Result Date: 05/09/2020 CLINICAL DATA:  Dementia, fell EXAM: CT HEAD WITHOUT CONTRAST TECHNIQUE: Contiguous axial images were obtained from the base of the skull through the vertex without intravenous contrast. COMPARISON:  07/27/2019 FINDINGS: Brain: Chronic encephalomalacia right frontal lobe. No acute infarct or hemorrhage. Lateral ventricles and midline structures are stable. No  acute extra-axial fluid collections. No mass effect. Vascular: No hyperdense vessel or unexpected calcification. Skull: Stable appearance without acute or destructive lesion. Sinuses/Orbits: Polypoid mucosal thickening right ethmoid air cells. Remaining sinuses are clear. Other: None. IMPRESSION: 1. Stable exam, no acute intracranial process. Electronically Signed   By: Randa Ngo M.D.   On: 05/09/2020 20:58   CT Angio Chest PE W and/or Wo Contrast  Result Date: 05/10/2020 CLINICAL DATA:  Is generalized  weakness EXAM: CT ANGIOGRAPHY CHEST WITH CONTRAST TECHNIQUE: Multidetector CT imaging of the chest was performed using the standard protocol during bolus administration of intravenous contrast. Multiplanar CT image reconstructions and MIPs were obtained to evaluate the vascular anatomy. CONTRAST:  136mL OMNIPAQUE IOHEXOL 350 MG/ML SOLN COMPARISON:  None. FINDINGS: Cardiovascular: There is a optimal opacification of the pulmonary arteries. There is no central,segmental, or subsegmental filling defects within the pulmonary arteries. The heart is normal in size. No pericardial effusion or thickening. No evidence right heart strain. There is normal three-vessel brachiocephalic anatomy without proximal stenosis. Scattered aortic atherosclerosis is noted. Mediastinum/Nodes: No hilar, mediastinal, or axillary adenopathy. Thyroid gland, trachea, and esophagus demonstrate no significant findings. There is elevation of the right hemidiaphragm. Lungs/Pleura: Mildly increased interstitial markings with patchy airspace opacity seen at both lung bases. No pleural effusion or pneumothorax. No airspace consolidation. Upper Abdomen: No acute abnormalities present in the visualized portions of the upper abdomen. Musculoskeletal: There is a acute superior compression fracture of the T11 vertebral body with less than 25% loss in height. No retropulsion of fragments is seen. There is slight buckling of the anterior superior  cortex. There is also a age indeterminate superior compression deformity of the L1 vertebral body with less than 25% loss in height. Review of the MIP images confirms the above findings. Abdomen/pelvis: Hepatobiliary: Scattered small hypodense lesions are seen throughout the liver parenchyma the largest within the inferior left liver lobe measuring 2 cm. Main portal vein is patent. A partially calcified gallstone is present. Pancreas:  Fatty atrophy seen throughout the pancreas.  For. Spleen: Normal in size without focal abnormality. Adrenals/Urinary Tract: Both adrenal glands appear normal. There is a multilocular low-density lesions seen within the upper pole the left kidney measuring 5 cm. No renal or collecting system calculi. The partially decompressed bladder appears to have mild superior bladder wall thickening with question of hyperenhancement and fat stranding changes. Stomach/Bowel: The stomach, small bowel, are normal in appearance. There is a moderate amount of colonic stool with scattered colonic diverticula. A mildly dilated rectum filled with air and stool is seen. Vascular/Lymphatic: There are no enlarged mesenteric, retroperitoneal, or pelvic lymph nodes. Scattered aortic atherosclerotic calcifications are seen without aneurysmal dilatation. Reproductive: The patient is status post hysterectomy. No adnexal masses or collections seen. Other: No evidence of abdominal wall mass or hernia. Musculoskeletal: No acute or significant osseous findings. IMPRESSION: 1.  No central, segmental, or subsegmental pulmonary embolism. 2. Interstitial/patchy airspace opacities at both lung bases which may be due to chronic lung changes/atelectasis. 3. Acute superior compression fracture of the T11 vertebral body with less than 25% loss in height. No retropulsion of fragments. 4. Age indeterminate slight superior compression fracture of the L1 vertebral body with less than 25% loss in height. 5. Cholelithiasis 6. Mild  diffuse bladder wall thickening which may be due to chronic cystitis. 7.  Aortic Atherosclerosis (ICD10-I70.0). Electronically Signed   By: Prudencio Pair M.D.   On: 05/10/2020 01:39   CT ABDOMEN PELVIS W CONTRAST  Result Date: 05/10/2020 CLINICAL DATA:  Is generalized weakness EXAM: CT ANGIOGRAPHY CHEST WITH CONTRAST TECHNIQUE: Multidetector CT imaging of the chest was performed using the standard protocol during bolus administration of intravenous contrast. Multiplanar CT image reconstructions and MIPs were obtained to evaluate the vascular anatomy. CONTRAST:  154mL OMNIPAQUE IOHEXOL 350 MG/ML SOLN COMPARISON:  None. FINDINGS: Cardiovascular: There is a optimal opacification of the pulmonary arteries. There is no central,segmental, or subsegmental filling defects within the pulmonary arteries.  The heart is normal in size. No pericardial effusion or thickening. No evidence right heart strain. There is normal three-vessel brachiocephalic anatomy without proximal stenosis. Scattered aortic atherosclerosis is noted. Mediastinum/Nodes: No hilar, mediastinal, or axillary adenopathy. Thyroid gland, trachea, and esophagus demonstrate no significant findings. There is elevation of the right hemidiaphragm. Lungs/Pleura: Mildly increased interstitial markings with patchy airspace opacity seen at both lung bases. No pleural effusion or pneumothorax. No airspace consolidation. Upper Abdomen: No acute abnormalities present in the visualized portions of the upper abdomen. Musculoskeletal: There is a acute superior compression fracture of the T11 vertebral body with less than 25% loss in height. No retropulsion of fragments is seen. There is slight buckling of the anterior superior cortex. There is also a age indeterminate superior compression deformity of the L1 vertebral body with less than 25% loss in height. Review of the MIP images confirms the above findings. Abdomen/pelvis: Hepatobiliary: Scattered small hypodense  lesions are seen throughout the liver parenchyma the largest within the inferior left liver lobe measuring 2 cm. Main portal vein is patent. A partially calcified gallstone is present. Pancreas:  Fatty atrophy seen throughout the pancreas.  For. Spleen: Normal in size without focal abnormality. Adrenals/Urinary Tract: Both adrenal glands appear normal. There is a multilocular low-density lesions seen within the upper pole the left kidney measuring 5 cm. No renal or collecting system calculi. The partially decompressed bladder appears to have mild superior bladder wall thickening with question of hyperenhancement and fat stranding changes. Stomach/Bowel: The stomach, small bowel, are normal in appearance. There is a moderate amount of colonic stool with scattered colonic diverticula. A mildly dilated rectum filled with air and stool is seen. Vascular/Lymphatic: There are no enlarged mesenteric, retroperitoneal, or pelvic lymph nodes. Scattered aortic atherosclerotic calcifications are seen without aneurysmal dilatation. Reproductive: The patient is status post hysterectomy. No adnexal masses or collections seen. Other: No evidence of abdominal wall mass or hernia. Musculoskeletal: No acute or significant osseous findings. IMPRESSION: 1.  No central, segmental, or subsegmental pulmonary embolism. 2. Interstitial/patchy airspace opacities at both lung bases which may be due to chronic lung changes/atelectasis. 3. Acute superior compression fracture of the T11 vertebral body with less than 25% loss in height. No retropulsion of fragments. 4. Age indeterminate slight superior compression fracture of the L1 vertebral body with less than 25% loss in height. 5. Cholelithiasis 6. Mild diffuse bladder wall thickening which may be due to chronic cystitis. 7.  Aortic Atherosclerosis (ICD10-I70.0). Electronically Signed   By: Prudencio Pair M.D.   On: 05/10/2020 01:39   DG Pelvis Portable  Result Date: 05/09/2020 CLINICAL  DATA:  Pain after fall EXAM: PORTABLE PELVIS 1-2 VIEWS COMPARISON:  None. FINDINGS: The study is markedly limited due to patient positioning. No fractures are identified. IMPRESSION: Markedly limited study due to positioning with no fractures identified. Electronically Signed   By: Dorise Bullion III M.D   On: 05/09/2020 20:52   DG Chest Portable 1 View  Result Date: 05/09/2020 CLINICAL DATA:  Weakness. EXAM: PORTABLE CHEST 1 VIEW COMPARISON:  July 27, 2019 FINDINGS: Bilateral pulmonary infiltrates, left greater than right. The cardiomediastinal silhouette is stable. No other acute abnormalities. IMPRESSION: Bilateral pulmonary opacities may represent multifocal pneumonia versus pulmonary edema. Recommend clinical correlation and attention on follow-up. Electronically Signed   By: Dorise Bullion III M.D   On: 05/09/2020 20:51    EKG: Independently reviewed.  Assessment/Plan Principal Problem:   Acute lower UTI Active Problems:   Memory loss   Compression  fracture of thoracic vertebra (HCC)   Compression fracture of lumbar vertebra (HCC)   Chronic respiratory failure with hypoxia ([place])    1. Acute UTI - 1. Rocephin 2. Culture pending 2. T11 and L1 compression fractures - 1. Norco PRN 2. May want to get IR eval in AM if pain remains uncontrolled to see if kyphoplasty / vertebroplasty is of benefit 3. Alzheimers dementia, memory loss - 1. Cont Namenda, lamictal, seroquel 4. COPD - 1. Cont O2 via [address] 2. Cont home nebs 5. Mild AKI - BUN 60 1. IVF: 500cc bolus and LR at 125 2. Intake and output 3. Repeat BMP in AM  DVT prophylaxis: Lovenox Code Status: Full Family Communication: No family in room Disposition Plan: return to SNF after UTI treatment and pain controlled for compression fractures Consults called: None Admission status: Place in obs    Aerilynn Goin, [place] [facility]  How to contact the [facility] Attending or Consulting provider [place] or covering provider  during after hours [place], for this patient?  1. Check the care team in [facility] and look for a) attending/consulting [facility] provider listed and b) the [facility] team listed 2. Log into [email or web address]  Amion Physician Scheduling and messaging for groups and whole hospitals  On call and physician scheduling software for group practices, residents, hospitalists and other medical providers for call, clinic, rotation and shift schedules. OnCall [employer] is a hospital-wide system for scheduling doctors and paging doctors on call. EasyPlot is for scientific plotting and data analysis.  [email or web address]  and use [place]'s universal password to access. If you do not have the password, please contact the hospital operator.  3. Locate the [facility] provider you are looking for under [facility] and page to a number that you can be directly reached. 4. If you still have difficulty reaching the provider, please page the [facility] (Director on Call) for the Hospitalists listed on amion for assistance.  05/10/2020, 3:15 AM

## 2020-05-10 NOTE — Progress Notes (Addendum)
Same day note  Patient seen and examined at bedside.  Patient was admitted to the hospital for back pain body pain and difficulty ambulating  At the time of my evaluation, patient complains of back pain, fatigue, ambulatory dysfunction  Physical examination reveals female intermittently somnolent ,appears to be in pain, moans on minimal touch, very debilitated and deconditioned.  Poor historian.  History of dementia.  On home oxygen.  Laboratory data and imaging was reviewed,  Assessment and Plan.  Confusion, hallucinations.  Urinalysis was abnormal on presentation.  Could be asymptomatic bacteriuria.   Follow urine cultures.  No leukocytosis or fever.  Patient does have underlying Alzheimer's dementia at baseline.  T11 and L1 compression fractures, severe back pain. Patient complains of diffuse back pain including shoulder pain and other body area pain.  Tenderness on palpation of the spine area.  CT scan of the abdomen shows less than 25% height loss of L1 and T11 vertebra continue analgesia.  Might benefit from IR guided intervention if remains persistently symptomatic.will discuss with IR in am.   Alzheimers dementia, memory loss  Cont Namenda, lamictal, seroquel.  On Norco.  Will trying to avoid IV narcotics if possible due to somnolence and confusion.  COPD on home oxygen. Continue nebs.    Mild acute kidney injury with BUN of 60 on presentation.  Received 500 mL of normal saline bolus followed by LR at 125.  Continue intake and output charting.  Reassess need for fluids in a.m.  Debility, deconditioning ambulatory dysfunction.  Will get physical therapy evaluation, when pain is better.  Borderline hypernatremia.  Continue to monitor closely.  Patient is from a skilled nursing facility at [facility] with the patient's daughter Ms [name] on the phone and updated her about the clinical condition of the patient.   No Charge  Signed,  Delila Pereyra, MD [facility]

## 2020-05-10 NOTE — Plan of Care (Signed)
POC initiated 

## 2020-05-10 NOTE — Plan of Care (Signed)
  Problem: Clinical Measurements: Goal: Diagnostic test results will improve Outcome: Progressing   Problem: Clinical Measurements: Goal: Respiratory complications will improve Outcome: Progressing   Problem: Clinical Measurements: Goal: Cardiovascular complication will be avoided Outcome: Progressing   Problem: Pain Managment: Goal: General experience of comfort will improve Outcome: Progressing   Problem: Skin Integrity: Goal: Risk for impaired skin integrity will decrease Outcome: Progressing

## 2020-05-11 DIAGNOSIS — M549 Dorsalgia, unspecified: Secondary | ICD-10-CM

## 2020-05-11 LAB — COMPREHENSIVE METABOLIC PANEL
ALT: 16 U/L (ref 0–44)
AST: 16 U/L (ref 15–41)
Albumin: 3.1 g/dL — ABNORMAL LOW (ref 3.5–5.0)
Alkaline Phosphatase: 71 U/L (ref 38–126)
Anion gap: 11 (ref 5–15)
BUN: 54 mg/dL — ABNORMAL HIGH (ref 8–23)
CO2: 27 mmol/L (ref 22–32)
Calcium: 9.6 mg/dL (ref 8.9–10.3)
Chloride: 106 mmol/L (ref 98–111)
Creatinine, Ser: 0.88 mg/dL (ref 0.44–1.00)
GFR, Estimated: >60 mL/min (ref >60)
Glucose, Bld: 112 mg/dL — ABNORMAL HIGH (ref 70–99)
Potassium: 3.8 mmol/L (ref 3.5–5.1)
Sodium: 144 mmol/L (ref 135–145)
Total Bilirubin: 0.6 mg/dL (ref 0.3–1.2)
Total Protein: 6.7 g/dL (ref 6.5–8.1)

## 2020-05-11 LAB — MAGNESIUM: Magnesium: 2 mg/dL (ref 1.7–2.4)

## 2020-05-11 LAB — PHOSPHORUS: Phosphorus: 3.1 mg/dL (ref 2.5–4.6)

## 2020-05-11 NOTE — Plan of Care (Signed)
  Problem: Elimination: Goal: Will not experience complications related to bowel motility 05/11/2020 0759 by Deetta Perla, RN Outcome: Completed/Met 05/11/2020 0758 by Deetta Perla, RN Outcome: Progressing Goal: Will not experience complications related to urinary retention 05/11/2020 0759 by Deetta Perla, RN Outcome: Progressing 05/11/2020 0758 by Deetta Perla, RN Outcome: Progressing   Problem: Pain Managment: Goal: General experience of comfort will improve 05/11/2020 0759 by Deetta Perla, RN Outcome: Progressing 05/11/2020 0758 by Deetta Perla, RN Outcome: Progressing   Problem: Safety: Goal: Ability to remain free from injury will improve 05/11/2020 0759 by Deetta Perla, RN Outcome: Completed/Met 05/11/2020 0758 by Deetta Perla, RN Outcome: Progressing   Problem: Skin Integrity: Goal: Risk for impaired skin integrity will decrease 05/11/2020 0759 by Deetta Perla, RN Outcome: Progressing 05/11/2020 0758 by Deetta Perla, RN Outcome: Progressing   Problem: Urinary Elimination: Goal: Signs and symptoms of infection will decrease 05/11/2020 0759 by Deetta Perla, RN Outcome: Completed/Met 05/11/2020 0758 by Deetta Perla, RN Outcome: Progressing

## 2020-05-11 NOTE — Evaluation (Addendum)
Physical Therapy Evaluation Patient Details Name: Kristina Watson MRN: [id number] DOB: 22-Oct-1935 Today's Date: 05/11/2020   History of Present Illness  84 y.o. female with medical history significant of TBI age 89,  COPD on home O2, HTN, dementia, prior PE not on anticoagulation. adm with recent fall at facility, Acute T11 compression fracture, and age-indeterminate L1 compression fracture. elevated d dimer however negative for DVT  Clinical Impression  Pt admitted with above diagnosis.  Pt is resistant to any and all movement (bed mobility or extremities, screams with very light touch).  Pt reports she does this at her baseline. Refuses to grip PT's hand and then tightly grasps bed rail without difficulty, refuses to move UEs then squeezes pillow, etc. Will follow on a trial basis. Recommend SNF post acute however pt does demonstrate limited rehab potential. Pt currently with functional limitations due to the deficits listed below (see PT Problem List). Pt will benefit from skilled PT to increase their independence and safety with mobility to allow discharge to the venue listed below.       Follow Up Recommendations SNF    Equipment Recommendations  None recommended by PT    Recommendations for Other Services       Precautions / Restrictions Precautions Precautions: Fall Restrictions Weight Bearing Restrictions: No      Mobility  Bed Mobility Overal bed mobility: Needs Assistance             General bed mobility comments: attempted rolling, pt screams repeatedly even when not being moved or touched. total assist to scoot up in supine using bed pad    Transfers                    Ambulation/Gait                Stairs            Wheelchair Mobility    Modified Rankin (Stroke Patients Only)       Balance Overall balance assessment: History of Falls                                           Pertinent Vitals/Pain Pain  Assessment: Faces Faces Pain Scale: Hurts worst Pain Descriptors / Indicators: Grimacing;Other (Comment) (screaming) Pain Intervention(s): Limited activity within patient's tolerance;Monitored during session;Repositioned    Home Living Family/patient expects to be discharged to:: Skilled nursing facility                 Additional Comments: pt unable to provide any infor regarding PLOF    Prior Function                 Hand Dominance        Extremity/Trunk Assessment   Upper Extremity Assessment Upper Extremity Assessment: Difficult to assess due to impaired cognition    Lower Extremity Assessment Lower Extremity Assessment: Difficult to assess due to impaired cognition       Communication      Cognition Arousal/Alertness: Awake/alert Behavior During Therapy: Agitated;Flat affect Overall Cognitive Status: History of cognitive impairments - at baseline                                 General Comments: pt states "no" to most all requests and encouragement to mobilize. pt screams to light (  feather light) touch and states this is her baseline (unsure of accuracy of this )      General Comments      Exercises     Assessment/Plan    PT Assessment Patient needs continued PT services  PT Problem List Decreased strength;Pain;Decreased cognition;Decreased mobility       PT Treatment Interventions Functional mobility training;Therapeutic activities;Therapeutic exercise;Patient/family education    PT Goals (Current goals can be found in the Care Plan section)  Acute Rehab PT Goals Patient Stated Goal: home PT Goal Formulation: Patient unable to participate in goal setting Time For Goal Achievement: 05/25/20 Potential to Achieve Goals: Poor    Frequency Min 1X/week   Barriers to discharge        Co-evaluation               AM-PAC PT "6 Clicks" Mobility  Outcome Measure Help needed turning from your back to your side while in a  flat bed without using bedrails?: Total Help needed moving from lying on your back to sitting on the side of a flat bed without using bedrails?: Total Help needed moving to and from a bed to a chair (including a wheelchair)?: Total Help needed standing up from a chair using your arms (e.g., wheelchair or bedside chair)?: Total Help needed to walk in hospital room?: Total Help needed climbing 3-5 steps with a railing? : Total 6 Click Score: 6    End of Session   Activity Tolerance: Patient limited by pain;Treatment limited secondary to agitation Patient left: in bed;with call bell/phone within reach;with bed alarm set   PT Visit Diagnosis: Other abnormalities of gait and mobility (R26.89)    Time: 1400-1415 PT Time Calculation (min) (ACUTE ONLY): 15 min   Charges:   PT Evaluation $PT Eval Low Complexity: [place], PT  Acute Rehab Dept ([place]) [phone] Pager [phone]  05/11/2020   [facility] 05/11/2020, 4:01 PM

## 2020-05-11 NOTE — Plan of Care (Signed)
  Problem: Education: Goal: Knowledge of General Education information will improve Description: Including pain rating scale, medication(s)/side effects and non-pharmacologic comfort measures 05/11/2020 1250 by Deetta Perla, RN Outcome: Progressing 05/11/2020 0759 by Deetta Perla, RN Outcome: Progressing 05/11/2020 0758 by Deetta Perla, RN Outcome: Progressing   Problem: Health Behavior/Discharge Planning: Goal: Ability to manage health-related needs will improve 05/11/2020 1250 by Deetta Perla, RN Outcome: Progressing 05/11/2020 0759 by Deetta Perla, RN Outcome: Progressing 05/11/2020 0758 by Deetta Perla, RN Outcome: Progressing   Problem: Clinical Measurements: Goal: Ability to maintain clinical measurements within normal limits will improve 05/11/2020 1250 by Deetta Perla, RN Outcome: Progressing 05/11/2020 0759 by Deetta Perla, RN Outcome: Progressing 05/11/2020 0758 by Deetta Perla, RN Outcome: Progressing Goal: Will remain free from infection 05/11/2020 1250 by Deetta Perla, RN Outcome: Progressing 05/11/2020 0759 by Deetta Perla, RN Outcome: Progressing 05/11/2020 0758 by Deetta Perla, RN Outcome: Progressing Goal: Diagnostic test results will improve 05/11/2020 1250 by Deetta Perla, RN Outcome: Progressing 05/11/2020 0759 by Deetta Perla, RN Outcome: Progressing 05/11/2020 0758 by Deetta Perla, RN Outcome: Progressing Goal: Respiratory complications will improve 05/11/2020 1250 by Deetta Perla, RN Outcome: Progressing 05/11/2020 0759 by Deetta Perla, RN Outcome: Progressing 05/11/2020 0758 by Deetta Perla, RN Outcome: Progressing Goal: Cardiovascular complication will be avoided 05/11/2020 1250 by Deetta Perla, RN Outcome: Progressing 05/11/2020 0759 by Deetta Perla, RN Outcome: Progressing 05/11/2020 0758 by Deetta Perla, RN Outcome: Progressing   Problem:  Activity: Goal: Risk for activity intolerance will decrease 05/11/2020 1250 by Deetta Perla, RN Outcome: Progressing 05/11/2020 0759 by Deetta Perla, RN Outcome: Progressing 05/11/2020 0758 by Deetta Perla, RN Outcome: Progressing   Problem: Nutrition: Goal: Adequate nutrition will be maintained 05/11/2020 0759 by Deetta Perla, RN Outcome: Completed/Met 05/11/2020 0758 by Deetta Perla, RN Outcome: Progressing   Problem: Elimination: Goal: Will not experience complications related to bowel motility 05/11/2020 0759 by Deetta Perla, RN Outcome: Completed/Met 05/11/2020 0758 by Deetta Perla, RN Outcome: Progressing Goal: Will not experience complications related to urinary retention 05/11/2020 1250 by Deetta Perla, RN Outcome: Progressing 05/11/2020 0759 by Deetta Perla, RN Outcome: Progressing 05/11/2020 0758 by Deetta Perla, RN Outcome: Progressing   Problem: Coping: Goal: Level of anxiety will decrease 05/11/2020 1250 by Deetta Perla, RN Outcome: Progressing 05/11/2020 0759 by Deetta Perla, RN Outcome: Progressing 05/11/2020 0758 by Deetta Perla, RN Outcome: Progressing   Problem: Pain Managment: Goal: General experience of comfort will improve 05/11/2020 1250 by Deetta Perla, RN Outcome: Progressing 05/11/2020 0759 by Deetta Perla, RN Outcome: Progressing 05/11/2020 0758 by Deetta Perla, RN Outcome: Progressing   Problem: Safety: Goal: Ability to remain free from injury will improve 05/11/2020 0759 by Deetta Perla, RN Outcome: Completed/Met 05/11/2020 0758 by Deetta Perla, RN Outcome: Progressing   Problem: Urinary Elimination: Goal: Signs and symptoms of infection will decrease 05/11/2020 0759 by Deetta Perla, RN Outcome: Completed/Met 05/11/2020 0758 by Deetta Perla, RN Outcome: Progressing
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
## 2020-05-11 NOTE — Plan of Care (Signed)

## 2020-05-12 DIAGNOSIS — R8271 Bacteriuria: Secondary | ICD-10-CM

## 2020-05-12 LAB — URINE CULTURE: Culture: >=100000 — AB

## 2020-05-12 MED ORDER — ALBUTEROL SULFATE HFA 108 (90 BASE) MCG/ACT IN AERS: 2.0000 | INHALATION_SPRAY | Freq: Four times a day (QID) | RESPIRATORY_TRACT | Status: DC | PRN

## 2020-05-12 MED ORDER — ALBUTEROL SULFATE (2.5 MG/3ML) 0.083% IN NEBU: 2.5000 mg | INHALATION_SOLUTION | Freq: Two times a day (BID) | RESPIRATORY_TRACT | Status: DC | PRN

## 2020-05-12 NOTE — TOC Initial Note (Signed)
Transition of Care [facility]) - Initial/Assessment Note    Patient Details  Name: Kristina Watson MRN: [id number] Date of Birth: [birth year]  Transition of Care (TOC) CM/SW Contact:    Joaquin Courts, RN Phone Number: 05/12/2020, 10:33 AM  Clinical Narrative:                 Patient is a resident of [facility].  CM spoke with facility rep who reports patient at baseline is able to ambulate with rolling walker but does have periods of confusion/hallucination and at that time will not wish to get up or do any activities.  CM discussed current PT evaluation results and questioned if facility is able to continue to care for patient.  Facility rep reports it is not unusual for patient to have periods where she does not participate in therapy but is unable to definitively state that facility can take patient back at dc.  CM requested that facility rep come to hospital to see patient and her current ADL level to help determine if she is close to baseline and can return to [facility] where physical therapy services are available if needed.  CM spoke with patient's daughter and POA and discussed PT recommendations and conversation with [facility] rep.  Daughter reports that patient normally is not interested in participation with PT, but if she needs therapy prior to return to [facility] then daughter would be agreeable to this plan if [facility] is unable to manage patient's level of care.  CM will continue to follow and communicate to daughter once [facility] weighs in on their ability to take patient back.   Expected Discharge Plan: Assisted Living Barriers to Discharge: Continued Medical Work up   Patient Goals and CMS Choice Patient states their goals for this hospitalization and ongoing recovery are:: to wither return to [facility] or go to rehab CMS Medicare.gov Compare Post Acute Care list provided to:: Patient Represenative (must comment) Choice offered to /  list presented to : Adult Children  Expected Discharge Plan and Services Expected Discharge Plan: Assisted Living   Discharge Planning Services: CM Consult   Living arrangements for the past 2 months: [place]                                      Prior Living Arrangements/Services Living arrangements for the past 2 months: [place] Lives with:: Facility Resident Patient language and need for interpreter reviewed:: Yes Do you feel safe going back to the place where you live?: Yes      Need for Family Participation in Patient Care: Yes (Comment) Care giver support system in place?: Yes (comment)   Criminal Activity/Legal Involvement Pertinent to Current Situation/Hospitalization: No - Comment as needed  Activities of Daily Living Home Assistive Devices/Equipment: Other (Comment) (unable to assess) ADL Screening (condition at time of admission) Patient's cognitive ability adequate to safely complete daily activities?: Yes Is the patient deaf or have difficulty hearing?: No Does the patient have difficulty seeing, even when wearing glasses/contacts?: Yes Does the patient have difficulty concentrating, remembering, or making decisions?: Yes Patient able to express need for assistance with ADLs?: Yes Does the patient have difficulty dressing or bathing?: Yes Independently performs ADLs?: No Does the patient have difficulty walking or climbing stairs?: Yes Weakness of Legs: Both Weakness of Arms/Hands: Both  Permission Sought/Granted  Emotional Assessment Appearance:: Appears stated age     Orientation: : Fluctuating Orientation (Suspected and/or reported [place])   Psych Involvement: No (comment)  Admission diagnosis:  Pain [R52] Hypoxia [R09.02] Compression fracture of thoracic vertebra ([place]) [S22.000A] Acute cystitis without hematuria [N30.00] Generalized weakness [R53.1] Back pain [M54.9] Patient Active  Problem List   Diagnosis Date Noted  . Compression fracture of thoracic vertebra ([place]) 05/10/2020  . Compression fracture of lumbar vertebra ([place]) 05/10/2020  . Chronic respiratory failure with hypoxia ([place]) 05/10/2020  . Asymptomatic bacteriuria 05/10/2020  . Back pain 05/10/2020  . Memory loss 07/30/2018  . Gait abnormality 07/30/2018  . Confusion 07/30/2018  . Acute on chronic respiratory failure with hypoxia ([place]) 01/09/2017  . COPD exacerbation ([place])   . DOE (dyspnea on exertion) 09/01/2011    Class: Hospitalized for  . Hypertension   . Hyperlipidemia     Class: Diagnosis of  . Pulmonary embolism (HCC)     Class: History of  . Obesity (BMI 35.0-39.9 without comorbidity)     Class: Chronic  . Asthma with COPD (chronic obstructive pulmonary disease) (HCC)     Class: Diagnosis of  . Abnormal nuclear stress test 08/30/2011    Class: Hospitalized for   PCP:  Patient, No Pcp Per Pharmacy:  No Pharmacies Listed    Social Determinants of Health (SDOH) Interventions    Readmission Risk Interventions No flowsheet data found.
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
## 2020-05-12 NOTE — TOC Progression Note (Addendum)
Transition of Care [facility]) - Progression Note    Patient Details  Name: Kristina Watson MRN: [id number] Date of Birth: [birth year]  [facility] CM/SW Contact  Joaquin Courts, RN Phone Number: 05/12/2020, 3:54 PM  Clinical Narrative:    CM followed up with [facility] rep who reports facility will be unable to take patient at her current level, but patient can return after completing short term rehab.  Voicemail left for patient's daughter.  FL[employer] faxed out. [address] [place], [place] [id number], requested clinicals faxed.    Expected Discharge Plan: Assisted Living Barriers to Discharge: Continued Medical Work up  Expected Discharge Plan and Services Expected Discharge Plan: Assisted Living   Discharge Planning Services: CM Consult   Living arrangements for the past 2 months: Assisted Living Facility                                       Social Determinants of Health (SDOH) Interventions    Readmission Risk Interventions No flowsheet data found.

## 2020-05-12 NOTE — Plan of Care (Signed)
  Problem: Education: Goal: Knowledge of General Education information will improve Description: Including pain rating scale, medication(s)/side effects and non-pharmacologic comfort measures Outcome: Progressing   Problem: Health Behavior/Discharge Planning: Goal: Ability to manage health-related needs will improve Outcome: Progressing   Problem: Clinical Measurements: Goal: Ability to maintain clinical measurements within normal limits will improve Outcome: Progressing Goal: Will remain free from infection Outcome: Progressing Goal: Diagnostic test results will improve Outcome: Progressing Goal: Respiratory complications will improve Outcome: Progressing Goal: Cardiovascular complication will be avoided Outcome: Progressing   Problem: Activity: Goal: Risk for activity intolerance will decrease Outcome: Progressing   Problem: Coping: Goal: Level of anxiety will decrease Outcome: Progressing   Problem: Elimination: Goal: Will not experience complications related to urinary retention Outcome: Progressing   Problem: Pain Managment: Goal: General experience of comfort will improve Outcome: Progressing   Problem: Skin Integrity: Goal: Risk for impaired skin integrity will decrease Outcome: Progressing

## 2020-05-12 NOTE — NC FL2 (Signed)
[place] LEVEL OF CARE SCREENING TOOL     IDENTIFICATION  Patient Name: Kristina Watson Birthdate: 10/24/1935 Sex: female Admission Date (Current Location): 05/09/2020  [facility] and [place] Number:  Herbalist and Address:  [facility],  [place] [address] [place]      Provider Number: [id number]  Attending Physician Name and Address:  Flora Lipps, MD  Relative Name and Phone Number:       Current Level of Care: Hospital Recommended Level of Care: [place] Prior Approval Number:    Date Approved/Denied:   PASRR Number: [id number]  Discharge Plan: SNF    Current Diagnoses: Patient Active Problem List   Diagnosis Date Noted  . Compression fracture of thoracic vertebra ([place]) 05/10/2020  . Compression fracture of lumbar vertebra ([place]) 05/10/2020  . Chronic respiratory failure with hypoxia ([place]) 05/10/2020  . Asymptomatic bacteriuria 05/10/2020  . Back pain 05/10/2020  . Memory loss 07/30/2018  . Gait abnormality 07/30/2018  . Confusion 07/30/2018  . Acute on chronic respiratory failure with hypoxia ([place]) 01/09/2017  . COPD exacerbation ([place])   . DOE (dyspnea on exertion) 09/01/2011  . Hypertension   . Hyperlipidemia   . Pulmonary embolism ([place])   . Obesity (BMI 35.0-39.9 without comorbidity)   . Asthma with COPD (chronic obstructive pulmonary disease) ([place])   . Abnormal nuclear stress test 08/30/2011    Orientation RESPIRATION BLADDER Height & Weight     Self, Place  Normal Incontinent Weight: 84.2 kg Height:  5\' 9"  (175.3 cm)  BEHAVIORAL SYMPTOMS/MOOD NEUROLOGICAL BOWEL NUTRITION STATUS      Continent Diet  AMBULATORY STATUS COMMUNICATION OF NEEDS Skin   Extensive Assist Verbally Normal                       Personal Care Assistance Level of Assistance  Bathing, Dressing, Total care Bathing Assistance: Maximum assistance   Dressing Assistance: Maximum assistance Total Care Assistance:  Maximum assistance   Functional Limitations Info             SPECIAL CARE FACTORS FREQUENCY  PT (By licensed PT), OT (By licensed OT)     PT Frequency: 5x weekly OT Frequency: 5x weekly            Contractures Contractures Info: Not present    Additional Factors Info  Code Status, Allergies Code Status Info: full Allergies Info: warfarin           Current Medications (05/12/2020):  This is the current hospital active medication list Current Facility-Administered Medications  Medication Dose Route Frequency Provider Last Rate Last Admin  . acetaminophen (TYLENOL) tablet 650 mg  650 mg Oral Q6H PRN Etta Quill, DO       Or  . acetaminophen (TYLENOL) suppository 650 mg  650 mg Rectal Q6H PRN Etta Quill, DO      . albuterol (PROVENTIL) (2.5 MG/3ML) 0.083% nebulizer solution 2.5 mg  2.5 mg Nebulization BID PRN Pokhrel, Laxman, MD      . albuterol (VENTOLIN HFA) 108 (90 Base) MCG/ACT inhaler 2 puff  2 puff Inhalation Q6H PRN Pokhrel, Laxman, MD      . alum & mag hydroxide-simeth (MAALOX/MYLANTA) 200-200-20 MG/5ML suspension 30 mL  30 mL Oral Q6H PRN Etta Quill, DO      . cholecalciferol (VITAMIN D) tablet 2,000 Units  2,000 Units Oral Daily Etta Quill, DO   2,000 Units at 05/12/20 0919  . enoxaparin (  LOVENOX) injection 40 mg  40 mg Subcutaneous Q24H Jennette Kettle M, DO   40 mg at 05/12/20 0920  . HYDROcodone-acetaminophen (NORCO/VICODIN) 5-325 MG per tablet 1-2 tablet  1-2 tablet Oral Q4H PRN Etta Quill, DO   1 tablet at 05/12/20 0919  . lactated ringers infusion   Intravenous Continuous Flora Lipps, MD   Stopped at 05/12/20 0535  . lamoTRIgine (LAMICTAL) tablet 100 mg  100 mg Oral BID Jennette Kettle M, DO   100 mg at 05/12/20 0919  . memantine (NAMENDA) tablet 10 mg  10 mg Oral BID Etta Quill, DO   10 mg at 05/12/20 0919  . nebivolol (BYSTOLIC) tablet 10 mg  10 mg Oral Daily Jennette Kettle M, DO   10 mg at 05/12/20 0919  . ondansetron  (ZOFRAN) tablet 4 mg  4 mg Oral Q6H PRN Etta Quill, DO       Or  . ondansetron [facility]) injection 4 mg  4 mg Intravenous Q6H PRN Etta Quill, DO      . polyvinyl alcohol (LIQUIFILM TEARS) 1.4 % ophthalmic solution 2 drop  2 drop Both Eyes PRN Pokhrel, Laxman, MD      . QUEtiapine (SEROQUEL) tablet 25 mg  25 mg Oral QHS Jennette Kettle M, DO   25 mg at 05/11/20 2044     Discharge Medications: Please see discharge summary for a list of discharge medications.  Relevant Imaging Results:  Relevant Lab Results:   Additional Information SSN [id number]  Joaquin Courts, RN

## 2020-05-13 DIAGNOSIS — S22000A Wedge compression fracture of unspecified thoracic vertebra, initial encounter for closed fracture: Secondary | ICD-10-CM

## 2020-05-13 DIAGNOSIS — S32000A Wedge compression fracture of unspecified lumbar vertebra, initial encounter for closed fracture: Secondary | ICD-10-CM

## 2020-05-13 LAB — CBC WITH DIFFERENTIAL/PLATELET
Abs Immature Granulocytes: 0.02 10*3/uL (ref 0.00–0.07)
Basophils Absolute: 0 10*3/uL (ref 0.0–0.1)
Basophils Relative: 0 %
Eosinophils Absolute: 0.5 10*3/uL (ref 0.0–0.5)
Eosinophils Relative: 8 %
HCT: 35.7 % — ABNORMAL LOW (ref 36.0–46.0)
Hemoglobin: 11.4 g/dL — ABNORMAL LOW (ref 12.0–15.0)
Immature Granulocytes: 0 %
Lymphocytes Relative: 24 %
Lymphs Abs: 1.4 10*3/uL (ref 0.7–4.0)
MCH: 30.4 pg (ref 26.0–34.0)
MCHC: 31.9 g/dL (ref 30.0–36.0)
MCV: 95.2 fL (ref 80.0–100.0)
Monocytes Absolute: 0.6 10*3/uL (ref 0.1–1.0)
Monocytes Relative: 10 %
Neutro Abs: 3.5 10*3/uL (ref 1.7–7.7)
Neutrophils Relative %: 58 %
Platelets: 188 10*3/uL (ref 150–400)
RBC: 3.75 MIL/uL — ABNORMAL LOW (ref 3.87–5.11)
RDW: 12.6 % (ref 11.5–15.5)
WBC: 6 10*3/uL (ref 4.0–10.5)
nRBC: 0 % (ref 0.0–0.2)

## 2020-05-13 LAB — COMPREHENSIVE METABOLIC PANEL
ALT: 20 U/L (ref 0–44)
AST: 24 U/L (ref 15–41)
Albumin: 2.7 g/dL — ABNORMAL LOW (ref 3.5–5.0)
Alkaline Phosphatase: 66 U/L (ref 38–126)
Anion gap: 7 (ref 5–15)
BUN: 25 mg/dL — ABNORMAL HIGH (ref 8–23)
CO2: 30 mmol/L (ref 22–32)
Calcium: 9.2 mg/dL (ref 8.9–10.3)
Chloride: 101 mmol/L (ref 98–111)
Creatinine, Ser: 0.7 mg/dL (ref 0.44–1.00)
GFR, Estimated: >60 mL/min (ref >60)
Glucose, Bld: 154 mg/dL — ABNORMAL HIGH (ref 70–99)
Potassium: 3.9 mmol/L (ref 3.5–5.1)
Sodium: 138 mmol/L (ref 135–145)
Total Bilirubin: 0.5 mg/dL (ref 0.3–1.2)
Total Protein: 6 g/dL — ABNORMAL LOW (ref 6.5–8.1)

## 2020-05-13 LAB — MAGNESIUM: Magnesium: 2 mg/dL (ref 1.7–2.4)

## 2020-05-13 LAB — PHOSPHORUS: Phosphorus: 2.9 mg/dL (ref 2.5–4.6)

## 2020-05-13 MED ORDER — FOSFOMYCIN TROMETHAMINE 3 G PO PACK
3.0000 g | PACK | Freq: Once | ORAL | Status: AC
  Administered 2020-05-13: 3 g via ORAL
  Filled 2020-05-13: qty 3

## 2020-05-13 MED ORDER — SODIUM CHLORIDE 0.9 % IV SOLN: 1.0000 g | INTRAVENOUS | Status: DC

## 2020-05-13 NOTE — TOC Progression Note (Signed)
Transition of Care [facility]) - Progression Note    Patient Details  Name: Kristina Watson MRN: [id number] Date of Birth: [birth year]  Transition of Care [facility]) CM/SW [place], [place] Phone Number: 05/13/2020, 4:43 PM  Clinical Narrative:    Physical Therapy Note faxed to [facility] health for review.  [employer] navi denied to pay for the patient rehab stay at [facility]. Navi offered an appeal to the pt. Family. [phone], fax: information to: [phone]. CSW notified the patient [name]. [name] declined to do appeal. She tearfully express how she knows the patient can walk and is choosing not to walk. She is hopes [name] will allow the patient to return. She does not want the patient to go to SNF for long term care in fear the patient will have a significant decline in her health.  CSW reached out to the [facility] and spoke with Travis She has requested to discuss the situation with the [employer].   Daughter is aware the patient is medically ready to transition.   Plan of care: [place] vs. SNF that will accept medicaid for LTC.    Expected Discharge Plan: Assisted Living Barriers to Discharge: Insurance Authorization  Expected Discharge Plan and Services Expected Discharge Plan: Assisted Living   Discharge Planning Services: CM Consult   Living arrangements for the past 2 months: Assisted Living Facility                                       Social Determinants of Health (SDOH) Interventions    Readmission Risk Interventions No flowsheet data found.
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
## 2020-05-13 NOTE — TOC Progression Note (Addendum)
Transition of Care [facility]) - Progression Note    Patient Details  Name: Kristina Watson MRN: [id number] Date of Birth: [birth year]  Transition of Care [facility]) CM/SW [place], [place] Phone Number: 05/13/2020, 12:42 PM  Clinical Narrative:    [place] insurance requested to complete a peer to peer with the attending physician. Dr. Sherral Hammers completed the peer to peer. [place] director has requested updated PT notes be faxed to [facility] before 3:00pm today.   CSW notified physical therapist and nurse.  Patient [name] notified. She has concerns about the patient being "too comfortable here" and not wanting to participate in therapy. Daughter has requested to be here to encourage the patient during her therapy session today.  Daughter will arrive soon. Physical Therapist aware of the plan.   Expected Discharge Plan: Assisted Living Barriers to Discharge: Insurance Authorization  Expected Discharge Plan and Services Expected Discharge Plan: Assisted Living   Discharge Planning Services: CM Consult   Living arrangements for the past 2 months: Assisted Living Facility                                       Social Determinants of Health (SDOH) Interventions    Readmission Risk Interventions No flowsheet data found.

## 2020-05-13 NOTE — Progress Notes (Signed)
Physical Therapy Treatment Patient Details Name: Kristina Watson MRN: [id number] DOB: April 26, 1936 Today's Date: 05/13/2020    History of Present Illness 84 y.o. female with medical history significant of TBI age 43, COPD on home O2, HTN, dementia, prior PE not on anticoagulation. adm with recent fall at facility, Acute T11 compression fracture, and age-indeterminate L1 compression fracture. elevated d dimer however negative for DVT    PT Comments    Patient received resting in bed, very flat affect. Patient states "I just want to stay here" when PT encouraged to mobilize to EOB. Pt initiated minimal LE mobility towards EOB with tactile/verbal cues for therapist. She was very resistant to movement for assist to EOB and screamed "No" several times. Pt able to roll over to her Lt side without assistance, supervision or safety. Once on her side pt again resistant to bring LE's off EOB or raise trunk up. Pt yelling "No" with the light touch of her bed sheet being moved to change due to soiling. She requires MAX +2 assist to roll back and complete linen change. Recommendation for SNF follow up remain appropriate. Unsure if pt may do better in her typical environment at the memory care unit she has resided since 2019. Will continue to follow or trial period with acute therapy to progress as able.   Follow Up Recommendations  SNF     Equipment Recommendations  None recommended by PT    Recommendations for Other Services       Precautions / Restrictions Precautions Precautions: Fall Restrictions Weight Bearing Restrictions: No    Mobility  Bed Mobility Overal bed mobility: Needs Assistance Bed Mobility: Rolling Rolling: Supervision;Total assist         General bed mobility comments: pt able to physically roll in bed without assist by using bed rail and bending LE's into hooklying position. However with attempts to progress mobility from sidelying to sit EOB pt screaming with light tight and  fighting assist to bring LE's off EOB. pt then screaming with assit from NT and PT to roll for linen change and refusing to roll despite ability to do so without assist.  Transfers                    Ambulation/Gait                 Stairs             Wheelchair Mobility    Modified Rankin (Stroke Patients Only)       Balance                                            Cognition Arousal/Alertness: Awake/alert Behavior During Therapy: Agitated;Flat affect Overall Cognitive Status: History of cognitive impairments - at baseline                                 General Comments: pt states "I don't want to.Marland KitchenMarland KitchenI want to stay here" when encouraged to mobilize to EOB. Pt screams with light touch and even movement of sheet to change soiled bed lines. Pt grabbing tightly to bed rail and resistance to help with mobility to roll in bed.      Exercises      General Comments        Pertinent Vitals/Pain Pain  Assessment: Faces Faces Pain Scale: Hurts worst Pain Location: general Pain Descriptors / Indicators: Grimacing;Other (Comment) (screaming "NO!") Pain Intervention(s): Repositioned;Monitored during session;Limited activity within patient's tolerance    Home Living Family/patient expects to be discharged to:: Skilled nursing facility               Additional Comments: pt has been residing at [facility] since March 2019. Pt's daughter providing all home and PLOF history.     Prior Function Level of Independence: Needs assistance;Independent with assistive device(s)  Gait / Transfers Assistance Needed: daughter reports pt typically walks with RW, PTA she was ambulating in hallways at [facility] with [place] and no assistance. ADL's / Homemaking Assistance Needed: Stephan Minister does provides meals and will bring to room if pt doesn't walk to dining hall. Pt's daughter is unsure if pt is bathing or dressing self or if  staff at [facility] assists. Comments: pt unable to provide any history.   PT Goals (current goals can now be found in the care plan section) Acute Rehab PT Goals Patient Stated Goal: home PT Goal Formulation: Patient unable to participate in goal setting Time For Goal Achievement: 05/25/20 Potential to Achieve Goals: Poor Progress towards PT goals: Not progressing toward goals - comment (self limiting)    Frequency    Min 1X/week      PT Plan Current plan remains appropriate    Co-evaluation              AM-PAC PT "6 Clicks" Mobility   Outcome Measure  Help needed turning from your back to your side while in a flat bed without using bedrails?: Total Help needed moving from lying on your back to sitting on the side of a flat bed without using bedrails?: Total Help needed moving to and from a bed to a chair (including a wheelchair)?: Total Help needed standing up from a chair using your arms (e.g., wheelchair or bedside chair)?: Total Help needed to walk in hospital room?: Total Help needed climbing 3-5 steps with a railing? : Total 6 Click Score: 6    End of Session   Activity Tolerance: Treatment limited secondary to agitation;Patient limited by pain Patient left: in bed;with call bell/phone within reach;with family/visitor present;with nursing/sitter in room Nurse Communication: Mobility status PT Visit Diagnosis: Other abnormalities of gait and mobility (R26.89)     Time: 7948-0165 PT Time Calculation (min) (ACUTE ONLY): 50 min  Charges:  $Therapeutic Activity: 38-52 mins                     Verner Mould, DPT Acute Rehabilitation Services  Office [phone] Pager [phone]  05/13/2020 2:30 PM

## 2020-05-14 LAB — COMPREHENSIVE METABOLIC PANEL WITH GFR
ALT: 30 U/L (ref 0–44)
AST: 36 U/L (ref 15–41)
Albumin: 2.9 g/dL — ABNORMAL LOW (ref 3.5–5.0)
Alkaline Phosphatase: 74 U/L (ref 38–126)
Anion gap: 7 (ref 5–15)
BUN: 22 mg/dL (ref 8–23)
CO2: 31 mmol/L (ref 22–32)
Calcium: 9.4 mg/dL (ref 8.9–10.3)
Chloride: 101 mmol/L (ref 98–111)
Creatinine, Ser: 0.74 mg/dL (ref 0.44–1.00)
GFR, Estimated: >60 mL/min
Glucose, Bld: 89 mg/dL (ref 70–99)
Potassium: 4.1 mmol/L (ref 3.5–5.1)
Sodium: 139 mmol/L (ref 135–145)
Total Bilirubin: 0.6 mg/dL (ref 0.3–1.2)
Total Protein: 6.2 g/dL — ABNORMAL LOW (ref 6.5–8.1)

## 2020-05-14 LAB — CBC WITH DIFFERENTIAL/PLATELET
Abs Immature Granulocytes: 0.02 K/uL (ref 0.00–0.07)
Basophils Absolute: 0 K/uL (ref 0.0–0.1)
Basophils Relative: 1 %
Eosinophils Absolute: 0.5 K/uL (ref 0.0–0.5)
Eosinophils Relative: 8 %
HCT: 36.4 % (ref 36.0–46.0)
Hemoglobin: 11.6 g/dL — ABNORMAL LOW (ref 12.0–15.0)
Immature Granulocytes: 0 %
Lymphocytes Relative: 33 %
Lymphs Abs: 2 K/uL (ref 0.7–4.0)
MCH: 30.3 pg (ref 26.0–34.0)
MCHC: 31.9 g/dL (ref 30.0–36.0)
MCV: 95 fL (ref 80.0–100.0)
Monocytes Absolute: 0.6 K/uL (ref 0.1–1.0)
Monocytes Relative: 9 %
Neutro Abs: 3.1 K/uL (ref 1.7–7.7)
Neutrophils Relative %: 49 %
Platelets: 166 K/uL (ref 150–400)
RBC: 3.83 MIL/uL — ABNORMAL LOW (ref 3.87–5.11)
RDW: 12.7 % (ref 11.5–15.5)
WBC: 6.2 K/uL (ref 4.0–10.5)
nRBC: 0 % (ref 0.0–0.2)

## 2020-05-14 LAB — SARS CORONAVIRUS 2 BY RT PCR (HOSPITAL ORDER, PERFORMED IN ~~LOC~~ HOSPITAL LAB): SARS Coronavirus 2: NEGATIVE

## 2020-05-14 LAB — PHOSPHORUS: Phosphorus: 2.6 mg/dL (ref 2.5–4.6)

## 2020-05-14 LAB — MAGNESIUM: Magnesium: 1.8 mg/dL (ref 1.7–2.4)

## 2020-05-14 MED ORDER — HYDROCODONE-ACETAMINOPHEN 5-325 MG PO TABS
1.0000 | ORAL_TABLET | ORAL | 0 refills | Status: AC | PRN
Start: 2020-05-14 — End: ?

## 2020-05-14 MED ORDER — ONDANSETRON HCL 4 MG PO TABS: 4.0000 mg | ORAL_TABLET | Freq: Four times a day (QID) | ORAL | 0 refills | Status: AC | PRN

## 2020-05-14 NOTE — NC FL2 (Addendum)
[place] LEVEL OF CARE SCREENING TOOL     IDENTIFICATION  Patient Name: Kristina Watson Birthdate: February 03, 1936 Sex: female Admission Date (Current Location): 05/09/2020  [facility] and [place] Number:  Herbalist and Address:  [facility],  [address] [place], [place]      Provider Number: [id number]  Attending Physician Name and Address:  Allie Bossier, MD  Relative Name and Phone Number:  Jones,Janet Daughter [phone]    Current Level of Care: Hospital Recommended Level of Care: Memory Care Prior Approval Number:    Date Approved/Denied:   PASRR Number: [id number]  Discharge Plan: Other (Comment) (Memory Care)    Current Diagnoses: Patient Active Problem List   Diagnosis Date Noted  . Compression fracture of thoracic vertebra ([place]) 05/10/2020  . Compression fracture of lumbar vertebra ([place]) 05/10/2020  . Chronic respiratory failure with hypoxia ([place]) 05/10/2020  . Asymptomatic bacteriuria 05/10/2020  . Back pain 05/10/2020  . Memory loss 07/30/2018  . Gait abnormality 07/30/2018  . Confusion 07/30/2018  . Acute on chronic respiratory failure with hypoxia ([place]) 01/09/2017  . COPD exacerbation ([place])   . DOE (dyspnea on exertion) 09/01/2011  . Hypertension   . Hyperlipidemia   . Pulmonary embolism ([place])   . Obesity (BMI 35.0-39.9 without comorbidity)   . Asthma with COPD (chronic obstructive pulmonary disease) ([place])   . Abnormal nuclear stress test 08/30/2011   Patient has primary diagnosis of Dementia. Refer to physician discharge summary.   Orientation RESPIRATION BLADDER Height & Weight     Self,Place  Normal Incontinent Weight: 185 lb 10 oz (84.2 kg) Height:  5\' 9"  (175.3 cm)  BEHAVIORAL SYMPTOMS/MOOD NEUROLOGICAL BOWEL NUTRITION STATUS      Continent Diet Regular   AMBULATORY STATUS COMMUNICATION OF NEEDS Skin   Extensive Assist Verbally Normal                       Personal Care Assistance Level of  Assistance  Bathing,Dressing Bathing Assistance: Maximum assistance   Dressing Assistance: Maximum assistance Total Care Assistance: Maximum assistance   Functional Limitations Info  Sight,Hearing,Speech Sight Info: Adequate Hearing Info: Adequate Speech Info: Adequate    SPECIAL CARE FACTORS FREQUENCY  PT (By licensed PT),OT (By licensed OT)     PT Frequency: 5x/weekly OT Frequency: 5x/weekly            Contractures Contractures Info: Not present    Additional Factors Info  Code Status,Allergies Code Status Info: Fullcode Allergies Info: Warfarin           Current Medications (05/14/2020):   Medication List    STOP taking these medications   loperamide 2 MG capsule Commonly known as: IMODIUM   magnesium hydroxide 400 MG/5ML suspension Commonly known as: MILK OF MAGNESIA     TAKE these medications   acetaminophen 500 MG tablet Commonly known as: TYLENOL Take 500 mg by mouth every 4 (four) hours as needed for mild pain, fever or headache.   albuterol (2.5 MG/3ML) 0.083% nebulizer solution Commonly known as: PROVENTIL Take 2.5 mg by nebulization 2 (two) times daily as needed for shortness of breath.   albuterol 108 (90 Base) MCG/ACT inhaler Commonly known as: VENTOLIN HFA Inhale 2 puffs into the lungs every 6 (six) hours as needed for wheezing or shortness of breath.   alum & mag hydroxide-simeth 200-200-20 MG/5ML suspension Commonly known as: MAALOX/MYLANTA Take 30 mLs by mouth every 6 (six) hours as needed for indigestion or  heartburn.   Bystolic 10 MG tablet Generic drug: nebivolol TAKE 1 TABLET BY MOUTH DAILY** NEED APPOINTMENT** What changed: See the new instructions.   guaifenesin 100 MG/5ML syrup Commonly known as: ROBITUSSIN Take 200 mg by mouth every 6 (six) hours as needed for cough.   HYDROcodone-acetaminophen 5-325 MG tablet Commonly known as: NORCO/VICODIN Take 1-2 tablets by mouth every 4 (four) hours as needed for moderate  pain.   lamoTRIgine 100 MG tablet Commonly known as: LAMICTAL Take 1 tablet (100 mg total) by mouth 2 (two) times daily. Please call [phone] to schedule follow up. What changed: additional instructions   memantine 10 MG tablet Commonly known as: Namenda Take 1 tablet (10 mg total) by mouth 2 (two) times daily.   multivitamin tablet Take 1 tablet by mouth daily.   neomycin-bacitracin-polymyxin [phone] ointment Apply 1 application topically every 6 (six) hours as needed (abrasions).   ondansetron 4 MG tablet Commonly known as: ZOFRAN Take 1 tablet (4 mg total) by mouth every 6 (six) hours as needed for nausea.   OXYGEN Inhale 2 L/min into the lungs as needed (for shortness of breath).   QUEtiapine 25 MG tablet Commonly known as: SEROQUEL Take 1 tablet (25 mg total) by mouth at bedtime. Please call [phone] to schedule an appt.   Vitamin D (Cholecalciferol) 25 MCG (1000 UT) Tabs Take 50 mcg by mouth daily.     Discharge Medications: Please see discharge summary for a list of discharge medications.  Relevant Imaging Results:  Relevant Lab Results:   Additional Information SSN [id number]    Lia Hopping, LCSW

## 2020-05-14 NOTE — TOC Transition Note (Signed)
Transition of Care [facility] - CM/SW Discharge Note   Patient Details  Name: Kristina Watson MRN: [id number] Date of Birth: 18-Feb-1936  [facility]) CM/SW Contact:  Lia Hopping, [place] Phone Number: 05/14/2020, 10:23 AM   Clinical Narrative:    CSW spoke with [facility], she confirmed the facility will allow the patient to return w/physical therapy. Physician notified to put in home health PT orders.  Facility request covid test, physician notified.  FL2 completed. D/C summary to be faxed.  [name] is aware of the plan.     Final next level of care: Memory Care Barriers to Discharge: Barriers Resolved   Patient Goals and CMS Choice Patient states their goals for this hospitalization and ongoing recovery are:: to wither return to [facility] or go to rehab CMS Medicare.gov Compare Post Acute Care list provided to:: Patient Represenative (must comment) Choice offered to / list presented to : Adult Children  Discharge Placement              Patient chooses bed at: [facility] Patient to be transferred to facility by: [place] Name of family member notified: [name] Patient and family notified of of transfer: 05/14/20  Discharge Plan and Services   Discharge Planning Services: CM Consult                                 Social Determinants of Health (SDOH) Interventions     Readmission Risk Interventions No flowsheet data found.

## 2020-05-14 NOTE — Plan of Care (Signed)
Patient medically stable and ready for discharge. She is waiting on PT[facility] to pick her up

## 2020-05-14 NOTE — TOC Transition Note (Signed)
Transition of Care [facility]) - CM/SW Discharge Note   Patient Details  Name: Kristina Watson MRN: [id number] Date of Birth: 1935-07-10  Transition of Care [facility]) CM/SW Contact:  Lia Hopping, [place] Phone Number: 05/14/2020, 5:16 PM   Clinical Narrative:    Re: Discharge.  CSW called the facility several times to coordinate discharge throughout the day starting at 9:15am.  CSW received a call back at 4:07PM. The facility resident coordinator [place] stated they were been moving in a resident and did not have time to review paperwork. Crystal confirm FL2, D/C summary sufficient. Crystal states the patient can arrive at the facility  ANYTIME. There will be staff that accepts the patient.  CSW notified [place] [facility] called, ETA unknown.  CSW notified daughter Kristina Watson and explain the LATE DISCHARGE.  Daughter agreeable to send the patient this evening.   No other needs identified.      Final next level of care: Memory Care Barriers to Discharge: Barriers Resolved   Patient Goals and CMS Choice Patient states their goals for this hospitalization and ongoing recovery are:: to wither return to [facility] or go to rehab CMS Medicare.gov Compare Post Acute Care list provided to:: Patient Represenative (must comment) Choice offered to / list presented to : Adult Children  Discharge Placement              Patient chooses bed at: [facility] Patient to be transferred to facility by: [place] Name of family member notified: Daughter Kristina Watson Patient and family notified of of transfer: 05/14/20  Discharge Plan and Services   Discharge Planning Services: CM Consult                                 Social Determinants of Health (SDOH) Interventions     Readmission Risk Interventions No flowsheet data found.

## 2020-05-14 NOTE — Discharge Summary (Signed)
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 lower risk of mortality.    Report Status 05/12/2020 FINAL  Final   Organism ID, Bacteria ESCHERICHIA COLI (A)  Final      Susceptibility   Escherichia coli - MIC*    AMPICILLIN >=32 RESISTANT Resistant     CEFAZOLIN >=64 RESISTANT Resistant     CEFEPIME 16 RESISTANT Resistant     CEFTRIAXONE >=64 RESISTANT Resistant     CIPROFLOXACIN >=4 RESISTANT Resistant     GENTAMICIN <=1 SENSITIVE Sensitive     IMIPENEM 0.5 SENSITIVE Sensitive     NITROFURANTOIN <=16 SENSITIVE Sensitive     TRIMETH/SULFA >=320 RESISTANT Resistant     AMPICILLIN/SULBACTAM >=32 RESISTANT Resistant     PIP/TAZO <=4 SENSITIVE  Sensitive     * >=100,000 COLONIES/mL ESCHERICHIA COLI  SARS Coronavirus 2 by RT PCR (hospital order, performed in [place] hospital lab) Nasopharyngeal Nasopharyngeal Swab     Status: None   Collection Time: 05/14/20 10:20 AM   Specimen: Nasopharyngeal Swab  Result Value Ref Range Status   SARS Coronavirus 2 NEGATIVE NEGATIVE Final    Comment: (NOTE) SARS-CoV-2 target nucleic acids are NOT DETECTED.  The SARS-CoV-2 RNA is generally detectable in upper and lower respiratory specimens during the acute phase of infection. The lowest concentration of SARS-CoV-2 viral copies this assay can detect is 250 copies / mL. A negative result does not preclude SARS-CoV-2 infection and should not be used as the sole basis for treatment or other patient management decisions.  A negative result may occur with improper specimen collection / handling, submission of specimen other than nasopharyngeal swab, presence of viral mutation(s) within the areas targeted by this assay, and inadequate number of viral copies (<250 copies / mL). A negative result must be combined with clinical observations, patient history, and epidemiological information.  Fact Sheet for Patients:   StrictlyIdeas.no  Fact Sheet for Healthcare Providers: BankingDealers.co.za  This test is not yet approved or  cleared by the [place] FDA and has been authorized for detection and/or diagnosis of SARS-CoV-2 by FDA under an Emergency Use Authorization (EUA).  This EUA will remain in effect (meaning this test can be used) for the duration of the COVID-19  declaration under Section 564(b)(1) of the Act, 21 U.S.C. section 360bbb-3(b)(1), unless the authorization is terminated or revoked sooner.  Performed at [facility], [place] [address], [place], [place] [address]      Labs: Basic Metabolic Panel: Recent Labs  Lab 05/09/20 1750 05/10/20 0846  05/11/20 0308 05/13/20 1244 05/14/20 0325  NA 148* 148* 144 138 139  K 4.0 4.3 3.8 3.9 4.1  CL 108 110 106 101 101  CO2 27 28 27 30 31   GLUCOSE 131* 143* 112* 154* 89  BUN 62* 59* 54* 25* 22  CREATININE 1.05* 0.82 0.88 0.70 0.74  CALCIUM 10.5* 10.1 9.6 9.2 9.4  MG  --   --  2.0 2.0 1.8  PHOS  --   --  3.1 2.9 2.6   Liver Function Tests: Recent Labs  Lab 05/09/20 1750 05/11/20 0308 05/13/20 1244 05/14/20 0325  AST 21 16 24  36  ALT 19 16 20 30   ALKPHOS 92 71 66 74  BILITOT 1.2 0.6 0.5 0.6  PROT 8.4* 6.7 6.0* 6.2*  ALBUMIN 4.0 3.1* 2.7* 2.9*   No results for input(s): LIPASE, AMYLASE in the last 168 hours. No results for input(s): AMMONIA in the last 168 hours. CBC: Recent Labs  Lab 05/09/20 1750 05/10/20 0846 05/13/20 1244 05/14/20 0325  WBC 10.0 6.3 6.0 6.2  NEUTROABS 6.5  --  3.5 3.1  HGB 14.2 14.0 11.4* 11.6*  HCT 44.2 45.1 35.7* 36.4  MCV 94.8 97.4 95.2 95.0  PLT 250 200 188 166   Cardiac Enzymes: No results for input(s): CKTOTAL, CKMB, CKMBINDEX, TROPONINI in the last 168 hours. BNP: BNP (last 3 results) No results for input(s): BNP in the last 8760 hours.  ProBNP (last 3 results) No results for input(s): PROBNP in the last 8760 hours.  CBG: No results for input(s): GLUCAP in the last 168 hours.     Signed:  Dia Crawford, MD [facility] [phone] pager

## 2020-05-15 NOTE — Progress Notes (Signed)
Tried to call report to [facility] five times to phone number [phone]. Phone rings five times and then a tone occurs with no answering message announcement. Unable to make contact with facility.

## 2020-05-15 NOTE — Progress Notes (Deleted)
Patient discharged and transported via [place] to [facility]

## 2020-05-15 NOTE — Progress Notes (Signed)
Patient discharged and left via [facility] transport at 00:10.

## 2020-05-19 DIAGNOSIS — Z87440 Personal history of urinary (tract) infections: Secondary | ICD-10-CM | POA: Diagnosis not present

## 2020-05-19 DIAGNOSIS — R278 Other lack of coordination: Secondary | ICD-10-CM | POA: Diagnosis not present

## 2020-05-19 DIAGNOSIS — R2681 Unsteadiness on feet: Secondary | ICD-10-CM | POA: Diagnosis not present

## 2020-05-19 DIAGNOSIS — M6281 Muscle weakness (generalized): Secondary | ICD-10-CM | POA: Diagnosis not present

## 2020-05-19 DIAGNOSIS — R293 Abnormal posture: Secondary | ICD-10-CM | POA: Diagnosis not present

## 2020-05-19 DIAGNOSIS — G301 Alzheimer's disease with late onset: Secondary | ICD-10-CM | POA: Diagnosis not present

## 2020-05-19 DIAGNOSIS — R296 Repeated falls: Secondary | ICD-10-CM | POA: Diagnosis not present

## 2020-05-29 DIAGNOSIS — J45909 Unspecified asthma, uncomplicated: Secondary | ICD-10-CM | POA: Diagnosis not present

## 2020-06-07 ENCOUNTER — Emergency Department (HOSPITAL_COMMUNITY)
Admission: EM | Admit: 2020-06-07 | Discharge: 2020-07-07 | Disposition: E | Payer: Medicare Other | Attending: Emergency Medicine | Admitting: Emergency Medicine

## 2020-06-07 DIAGNOSIS — J441 Chronic obstructive pulmonary disease with (acute) exacerbation: Secondary | ICD-10-CM | POA: Insufficient documentation

## 2020-06-07 DIAGNOSIS — I499 Cardiac arrhythmia, unspecified: Secondary | ICD-10-CM | POA: Diagnosis not present

## 2020-06-07 DIAGNOSIS — Z79899 Other long term (current) drug therapy: Secondary | ICD-10-CM | POA: Insufficient documentation

## 2020-06-07 DIAGNOSIS — I469 Cardiac arrest, cause unspecified: Secondary | ICD-10-CM | POA: Diagnosis not present

## 2020-06-07 DIAGNOSIS — R0689 Other abnormalities of breathing: Secondary | ICD-10-CM | POA: Diagnosis not present

## 2020-06-07 DIAGNOSIS — I10 Essential (primary) hypertension: Secondary | ICD-10-CM | POA: Insufficient documentation

## 2020-06-07 DIAGNOSIS — Z743 Need for continuous supervision: Secondary | ICD-10-CM | POA: Diagnosis not present

## 2020-06-07 DIAGNOSIS — R4020 Unspecified coma: Secondary | ICD-10-CM | POA: Diagnosis not present

## 2020-06-07 DIAGNOSIS — R404 Transient alteration of awareness: Secondary | ICD-10-CM | POA: Diagnosis not present

## 2020-06-07 DIAGNOSIS — J45909 Unspecified asthma, uncomplicated: Secondary | ICD-10-CM | POA: Diagnosis not present

## 2020-06-07 MED ORDER — SODIUM CHLORIDE 0.9 % IV SOLN
INTRAVENOUS | Status: AC | PRN
  Administered 2020-06-07: 1000 mL via INTRAVENOUS

## 2020-06-07 MED ORDER — DEXTROSE 50 % IV SOLN
INTRAVENOUS | Status: AC | PRN
  Administered 2020-06-07: 1 via INTRAVENOUS

## 2020-06-07 MED ORDER — EPINEPHRINE 1 MG/10ML IJ SOSY
PREFILLED_SYRINGE | INTRAMUSCULAR | Status: AC | PRN
  Administered 2020-06-07: 1 via INTRAVENOUS

## 2020-06-29 DIAGNOSIS — J45909 Unspecified asthma, uncomplicated: Secondary | ICD-10-CM | POA: Diagnosis not present

## 2020-07-07 NOTE — ED Notes (Addendum)
Patient arrived @1046  with EMS, CPR via in progress since 0959. Per EMS patient was found down in the dining area of the nursing home with unkn down time. Patient received 10 amps of EPI and 450 mg of amiodarone via the left lower leg IO placed in the field. Patient moved over to stretcher and CPR remained in progress with 0960. Pulse check @ 1047. PEA on monitor. No pulses present. PIV's attempted to the left and right arms x 2 by Samuel Bouche, RN. Successful right arm 22 g placed. EPI given by this RN at 1048 via IO. Per EMS, BG in field was 88. D50 given via IO @1049 . Pulse check at 1050 by Dr Reche Dixon. TOD called by attending @ 1052. Daughter notified by MD. donor services called @ 1125 by this RN.

## 2020-07-07 NOTE — ED Provider Notes (Signed)
[place] EMERGENCY DEPARTMENT Provider Note   CSN: [id number] Arrival date & time: 06/28/20  1057     History No chief complaint on file.   Kristina Watson is a 85 y.o. female.  85 year old female presents via EMS for cardiac arrest.  Patient is a resident at [facility].  She had unwitnessed arrest.  EMS reports CPR initiated at 9:59.  Patient was not directly observed for approximately 5 to 10 minutes prior to being found slumped over in the chair of the facilty's living room area.  She was found to be pulseless and apneic.  Asystole was confirmed by EMS on their initial evaluation.  Patient was intubated in the field.  CPR was initiated.  By the time the patient presented to the ED CPR had been in progress for approximately 45 minutes.  Patient had already received 10 epi's.  She had been shocked 3 times.  Patient remains in asystole.  Patient was pronounced at 10:52 AM.  The history is provided by the EMS personnel and medical records.  Cardiac Arrest Witnessed by:  Not witnessed Incident location:  Nursing home Time since incident:  45 minutes Time before BLS initiated:  3-5 minutes Time before ALS initiated:  3-5 minutes Condition upon EMS arrival:  Apneic Pulse:  Absent Initial cardiac rhythm per EMS:  Asystole Treatments prior to arrival:  ACLS protocol Medications given prior to ED:  Epinephrine Airway: Intubated prior to arrival. Rhythm on admission to ED:  Asystole      Past Medical History:  Diagnosis Date  . Arthritis    "legs" (01/09/2017)  . Asthma with COPD (chronic obstructive pulmonary disease) ([place])   . Chronic bronchitis ([place])   . COPD (chronic obstructive pulmonary disease) ([place])   . Hyperlipidemia   . Hypertension   . Memory loss   . Multiple environmental allergies   . Obesity (BMI 35.0-39.9 without comorbidity)   . On home oxygen therapy    "1.5L usually when she sleeps" (01/09/2017)  . Osteoarthritis of shoulder   .  Pneumonia    "a couple times" (8//11/2016)  . Pulmonary embolism ([place]) ?2005  . Tachycardia     Patient Active Problem List   Diagnosis Date Noted  . Compression fracture of thoracic vertebra ([place]) 05/10/2020  . Compression fracture of lumbar vertebra ([place]) 05/10/2020  . Chronic respiratory failure with hypoxia ([place]) 05/10/2020  . Asymptomatic bacteriuria 05/10/2020  . Back pain 05/10/2020  . Memory loss 07/30/2018  . Gait abnormality 07/30/2018  . Confusion 07/30/2018  . Acute on chronic respiratory failure with hypoxia ([place]) 01/09/2017  . COPD exacerbation ([place])   . DOE (dyspnea on exertion) 09/01/2011    Class: Hospitalized for  . Hypertension   . Hyperlipidemia     Class: Diagnosis of  . Pulmonary embolism (HCC)     Class: History of  . Obesity (BMI 35.0-39.9 without comorbidity)     Class: Chronic  . Asthma with COPD (chronic obstructive pulmonary disease) (HCC)     Class: Diagnosis of  . Abnormal nuclear stress test 08/30/2011    Class: Hospitalized for    Past Surgical History:  Procedure Laterality Date  . ABDOMINAL HYSTERECTOMY    . CARDIAC CATHETERIZATION    . CATARACT EXTRACTION, BILATERAL Bilateral   . DILATION AND CURETTAGE OF UTERUS    . LEFT HEART CATHETERIZATION WITH CORONARY ANGIOGRAM N/A 09/01/2011   Procedure: LEFT HEART CATHETERIZATION WITH CORONARY ANGIOGRAM;  Surgeon: Leonie Man, MD;  Location: [facility] CATH  LAB;  Service: Cardiovascular;  Laterality: N/A;  . TONSILLECTOMY       OB History   No obstetric history on file.     Family History  Problem Relation Age of Onset  . Cancer Mother        pancreatic  . CAD Father   . Heart failure Brother        congenital heart disease  . Stroke Brother     Social History   Tobacco Use  . Smoking status: Never Smoker  . Smokeless tobacco: Never Used  . Tobacco comment: Long term second hand smoke exposure  Vaping Use  . Vaping Use: Never used  Substance Use Topics  . Alcohol use: No  . Drug  use: No    Home Medications Prior to Admission medications   Medication Sig Start Date End Date Taking? Authorizing Provider  acetaminophen (TYLENOL) 500 MG tablet Take 500 mg by mouth every 4 (four) hours as needed for mild pain, fever or headache.     [provider]  albuterol (PROVENTIL HFA;VENTOLIN HFA) 108 (90 Base) MCG/ACT inhaler Inhale 2 puffs into the lungs every 6 (six) hours as needed for wheezing or shortness of breath. 01/13/17   Florencia Reasons, MD  albuterol (PROVENTIL) (2.5 MG/3ML) 0.083% nebulizer solution Take 2.5 mg by nebulization 2 (two) times daily as needed for shortness of breath.     [provider]  alum & mag hydroxide-simeth (MAALOX/MYLANTA) 200-200-20 MG/5ML suspension Take 30 mLs by mouth every 6 (six) hours as needed for indigestion or heartburn.    [provider]  BYSTOLIC 10 MG tablet TAKE 1 TABLET BY MOUTH DAILY** NEED APPOINTMENT** Patient taking differently: Take 10 mg by mouth daily.  04/30/13   Leonie Man, MD  guaifenesin (ROBITUSSIN) 100 MG/5ML syrup Take 200 mg by mouth every 6 (six) hours as needed for cough.    [provider]  HYDROcodone-acetaminophen (NORCO/VICODIN) 5-325 MG tablet Take 1-2 tablets by mouth every 4 (four) hours as needed for moderate pain. 05/14/20   Allie Bossier, MD  lamoTRIgine (LAMICTAL) 100 MG tablet Take 1 tablet (100 mg total) by mouth 2 (two) times daily. Please call [phone] to schedule follow up. Patient taking differently: Take 100 mg by mouth 2 (two) times daily.  05/21/19   Marcial Pacas, MD  memantine (NAMENDA) 10 MG tablet Take 1 tablet (10 mg total) by mouth 2 (two) times daily. Patient taking differently: Take 10 mg by mouth 2 (two) times daily.  11/07/19   Suzzanne Cloud, NP  Multiple Vitamin (MULTIVITAMIN) tablet Take 1 tablet by mouth daily.    [provider]  neomycin-bacitracin-polymyxin (NEOSPORIN) [phone] ointment Apply 1 application topically every 6 (six) hours as  needed (abrasions).    [provider]  ondansetron (ZOFRAN) 4 MG tablet Take 1 tablet (4 mg total) by mouth every 6 (six) hours as needed for nausea. 05/14/20   Allie Bossier, MD  OXYGEN Inhale 2 L/min into the lungs as needed (for shortness of breath).     [provider]  QUEtiapine (SEROQUEL) 25 MG tablet Take 1 tablet (25 mg total) by mouth at bedtime. Please call [phone] to schedule an appt. 01/16/19   Marcial Pacas, MD  Vitamin D, Cholecalciferol, 25 MCG (1000 UT) TABS Take 50 mcg by mouth daily.    [provider]    Allergies    Warfarin sodium  Review of Systems   Review of Systems  Unable to perform ROS:  Acuity of condition    Physical Exam Updated Vital Signs There were no vitals taken for this visit.  Physical Exam Vitals and nursing note reviewed.  Constitutional:      Comments: In full cardiac arrest.  Kristina Watson providing chest compressions.  Ventilations provided through ET tube.  HENT:     Head: Normocephalic and atraumatic.     Mouth/Throat:     Comments: ET tube in place Eyes:     Comments: Pupils fixed dilated  Cardiovascular:     Comments: Asystole on monitor.  No pulse. Pulmonary:     Comments: No respiratory effort.  ET tube in place.  Good breath sounds bilaterally with bag ventilation. Abdominal:     Palpations: Abdomen is soft.  Musculoskeletal:     Cervical back: Neck supple.     Comments: Flaccid, mottled, cold.  Neurological:     Comments: Unresponsive in full cardiac arrest     ED Results / Procedures / Treatments   Labs (all labs ordered are listed, but only abnormal results are displayed) Labs Reviewed - No data to display  EKG None  Radiology No results found.  Procedures Procedures (including critical care time) CRITICAL CARE Performed by: Valarie Merino   Total critical care time: 30 minutes  Critical care time was exclusive of separately billable procedures and treating other  patients.  Critical care was necessary to treat or prevent imminent or life-threatening deterioration.  Critical care was time spent personally by me on the following activities: development of treatment plan with patient and/or surrogate as well as nursing, discussions with consultants, evaluation of patient's response to treatment, examination of patient, obtaining history from patient or surrogate, ordering and performing treatments and interventions, ordering and review of laboratory studies, ordering and review of radiographic studies, pulse oximetry and re-evaluation of patient's condition.   Medications Ordered in ED Medications - No data to display  ED Course  I have reviewed the triage vital signs and the nursing notes.  Pertinent labs & imaging results that were available during my care of the patient were reviewed by me and considered in my medical decision making (see chart for details).    MDM Rules/Calculators/A&P                          MDM  Screen complete  HEDI VANDEMAN was evaluated in Emergency Department on 06-30-20 for the symptoms described in the history of present illness. She was evaluated in the context of the global COVID-19 pandemic, which necessitated consideration that the patient might be at risk for infection with the SARS-CoV-2 virus that causes COVID-19. Institutional protocols and algorithms that pertain to the evaluation of patients at risk for COVID-19 are in a state of rapid change based on information released by regulatory bodies including the CDC and federal and state organizations. These policies and algorithms were followed during the patient's care in the ED.   Patient is presenting post unwitnessed cardiac arrest.  Patient with approximately 45 minutes of CPR provided prior to arrival.  Patient has been predominantly in asystole with EMS.    Patient noted to be in asystole on arrival.  Despite aggressive care patient is without  ROSC.  Patient pronounced at 10:52 AM.  Patient's daughter Floyde Parkins [id number]) is aware of the case and his conclusion.  She reports to this provider that she will not be at the [facility] facility until approximately 3 PM this afternoon.  Patient's primary care provider Merceda Elks, PA with [facility] [phone]) is aware of the case and it's outcome.  She is agreeable to having the patient's death certificate sent to her for completion.   Final Clinical Impression(s) / ED Diagnoses Final diagnoses:  Cardiac arrest [facility])    Rx / DC Orders ED Discharge Orders    None       Wynetta Fines, MD July 03, 2020 1137

## 2020-07-07 DEATH — deceased

## 2020-07-14 ENCOUNTER — Ambulatory Visit: Payer: Medicare Other | Admitting: Neurology

## 2021-05-08 NOTE — Progress Notes (Signed)
Chart reviewed, agree above plan ?
